# Patient Record
Sex: Female | Born: 1965 | Race: White | Hispanic: No | Marital: Married | Smoking: Never smoker
Health system: Southern US, Community
[De-identification: ages and names within clinical notes are randomized; demographics above are authoritative.]

## PROBLEM LIST (undated history)

## (undated) DIAGNOSIS — E538 Deficiency of other specified B group vitamins: Secondary | ICD-10-CM

## (undated) DIAGNOSIS — E6609 Other obesity due to excess calories: Secondary | ICD-10-CM

## (undated) DIAGNOSIS — R7303 Prediabetes: Secondary | ICD-10-CM

## (undated) DIAGNOSIS — E063 Autoimmune thyroiditis: Secondary | ICD-10-CM

## (undated) DIAGNOSIS — F419 Anxiety disorder, unspecified: Secondary | ICD-10-CM

## (undated) DIAGNOSIS — E079 Disorder of thyroid, unspecified: Secondary | ICD-10-CM

## (undated) DIAGNOSIS — E559 Vitamin D deficiency, unspecified: Secondary | ICD-10-CM

## (undated) DIAGNOSIS — E785 Hyperlipidemia, unspecified: Secondary | ICD-10-CM

## (undated) DIAGNOSIS — I10 Essential (primary) hypertension: Secondary | ICD-10-CM

## (undated) DIAGNOSIS — J309 Allergic rhinitis, unspecified: Secondary | ICD-10-CM

## (undated) DIAGNOSIS — D649 Anemia, unspecified: Secondary | ICD-10-CM

## (undated) DIAGNOSIS — R609 Edema, unspecified: Secondary | ICD-10-CM

## (undated) DIAGNOSIS — E119 Type 2 diabetes mellitus without complications: Secondary | ICD-10-CM

## (undated) DIAGNOSIS — E7801 Familial hypercholesterolemia: Secondary | ICD-10-CM

## (undated) DIAGNOSIS — E78019 Familial hypercholesterolemia, unspecified: Secondary | ICD-10-CM

## (undated) HISTORY — DX: Vitamin D deficiency, unspecified: E55.9

## (undated) HISTORY — DX: Hyperlipidemia, unspecified: E78.5

## (undated) HISTORY — PX: COMBINED HYSTEROSCOPY DIAGNOSTIC / D&C: SUR297

## (undated) HISTORY — DX: Edema, unspecified: R60.9

## (undated) HISTORY — DX: Deficiency of other specified B group vitamins: E53.8

## (undated) HISTORY — DX: Familial hypercholesterolemia, unspecified: E78.019

## (undated) HISTORY — DX: Essential (primary) hypertension: I10

## (undated) HISTORY — DX: Allergic rhinitis, unspecified: J30.9

## (undated) HISTORY — PX: HYSTEROSCOPY: SHX211

## (undated) HISTORY — DX: Familial hypercholesterolemia: E78.01

## (undated) HISTORY — DX: Other obesity due to excess calories: E66.09

## (undated) HISTORY — PX: TUBAL LIGATION: SHX77

## (undated) HISTORY — DX: Type 2 diabetes mellitus without complications: E11.9

## (undated) HISTORY — DX: Anemia, unspecified: D64.9

## (undated) HISTORY — DX: Anxiety disorder, unspecified: F41.9

## (undated) HISTORY — DX: Autoimmune thyroiditis: E06.3

## (undated) HISTORY — DX: Prediabetes: R73.03

## (undated) HISTORY — PX: OTHER SURGICAL HISTORY: SHX169

---

## 2008-04-21 ENCOUNTER — Ambulatory Visit: Payer: Self-pay | Admitting: Unknown Physician Specialty

## 2008-11-03 ENCOUNTER — Ambulatory Visit: Payer: Self-pay | Admitting: Unknown Physician Specialty

## 2008-11-12 ENCOUNTER — Ambulatory Visit: Payer: Self-pay | Admitting: Unknown Physician Specialty

## 2008-12-22 IMAGING — MG MAM DGTL SCREENING MAMMO W/CAD
1 series · 4 of 4 positions shown · non-contrast
Comparison: none

REASON FOR EXAM: scr mammo
COMMENTS:

PROCEDURE:     MAM - MAM DGTL SCREENING MAMMO W/CAD  - April 21, 2008  [DATE]
RESULT:      Comparison is made to the prior exam of 08/25/04.  The breast
parenchyma is almost entirely fatty.  No mass or malignant-appearing
calcifications are seen.

[R CC · right · 4 of 4 slices shown]
[im 1/4]
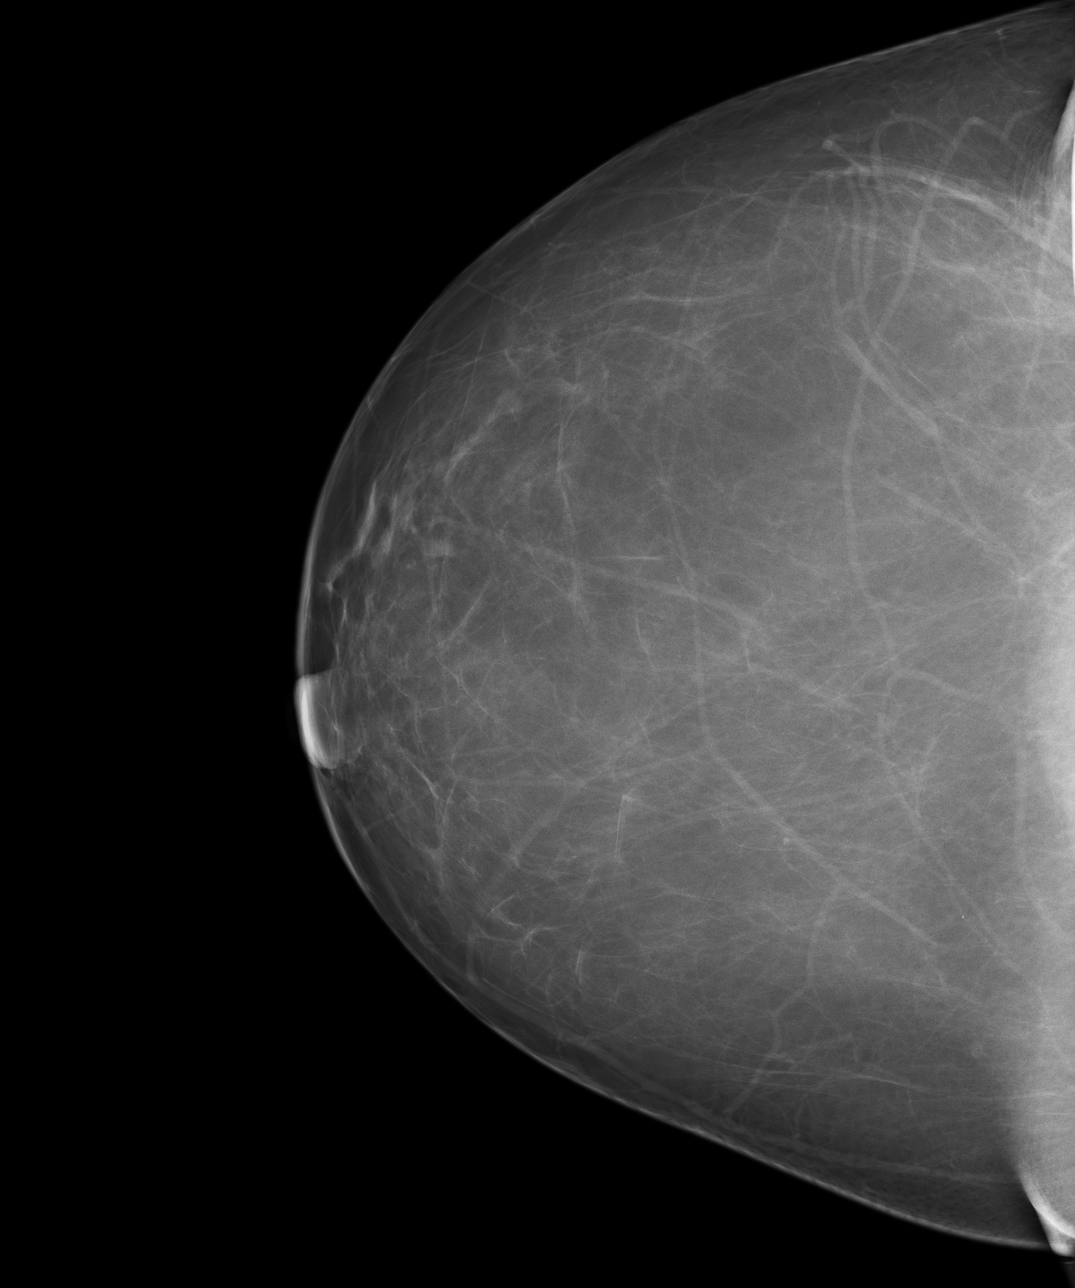
[im 2/4]
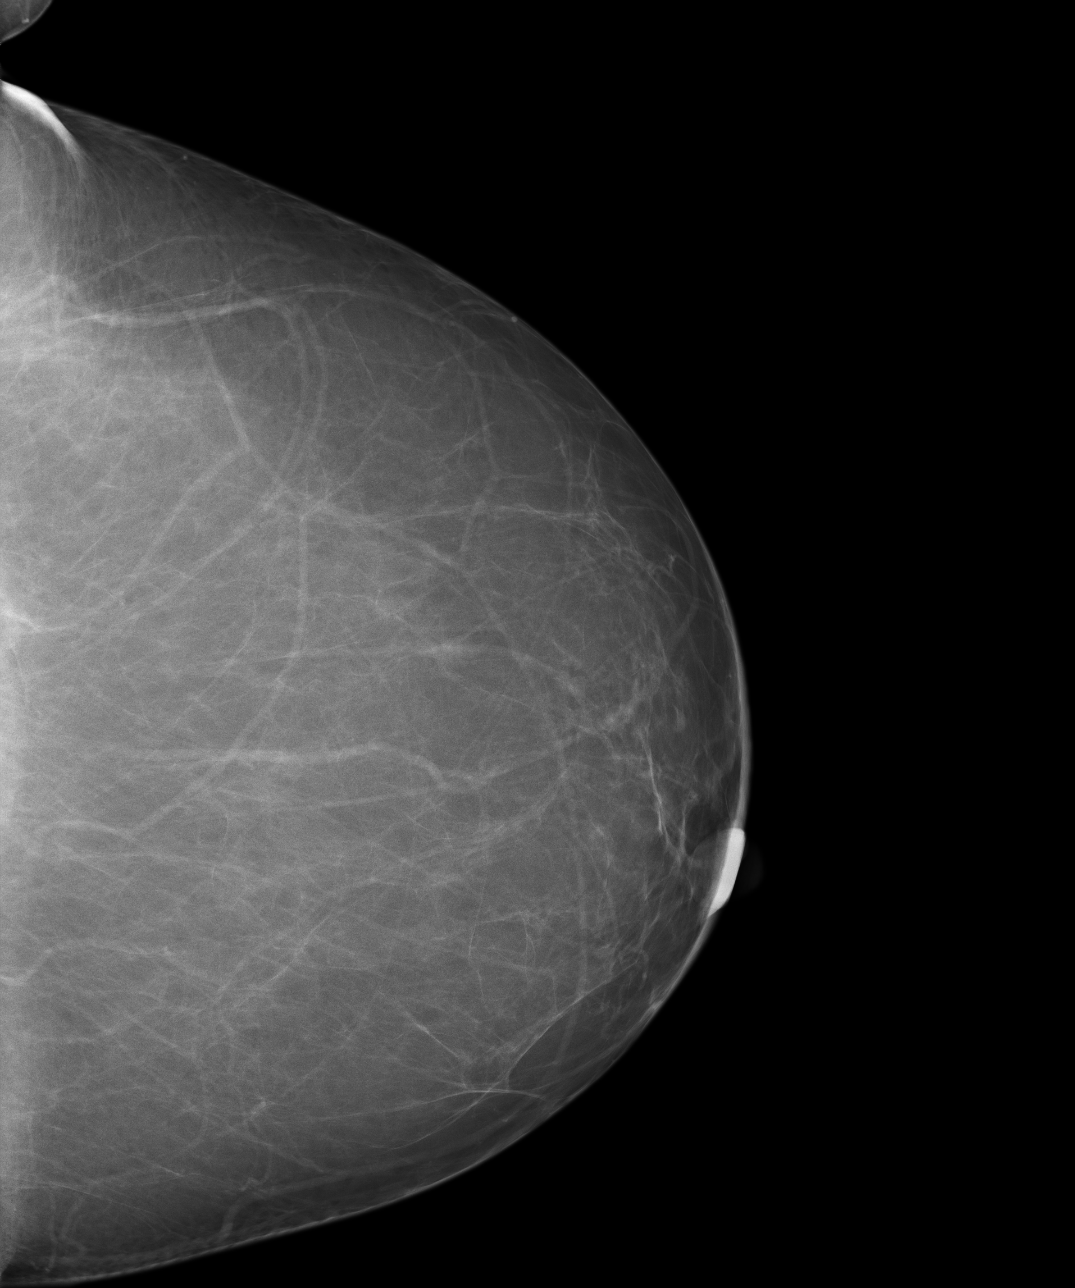
[im 3/4]
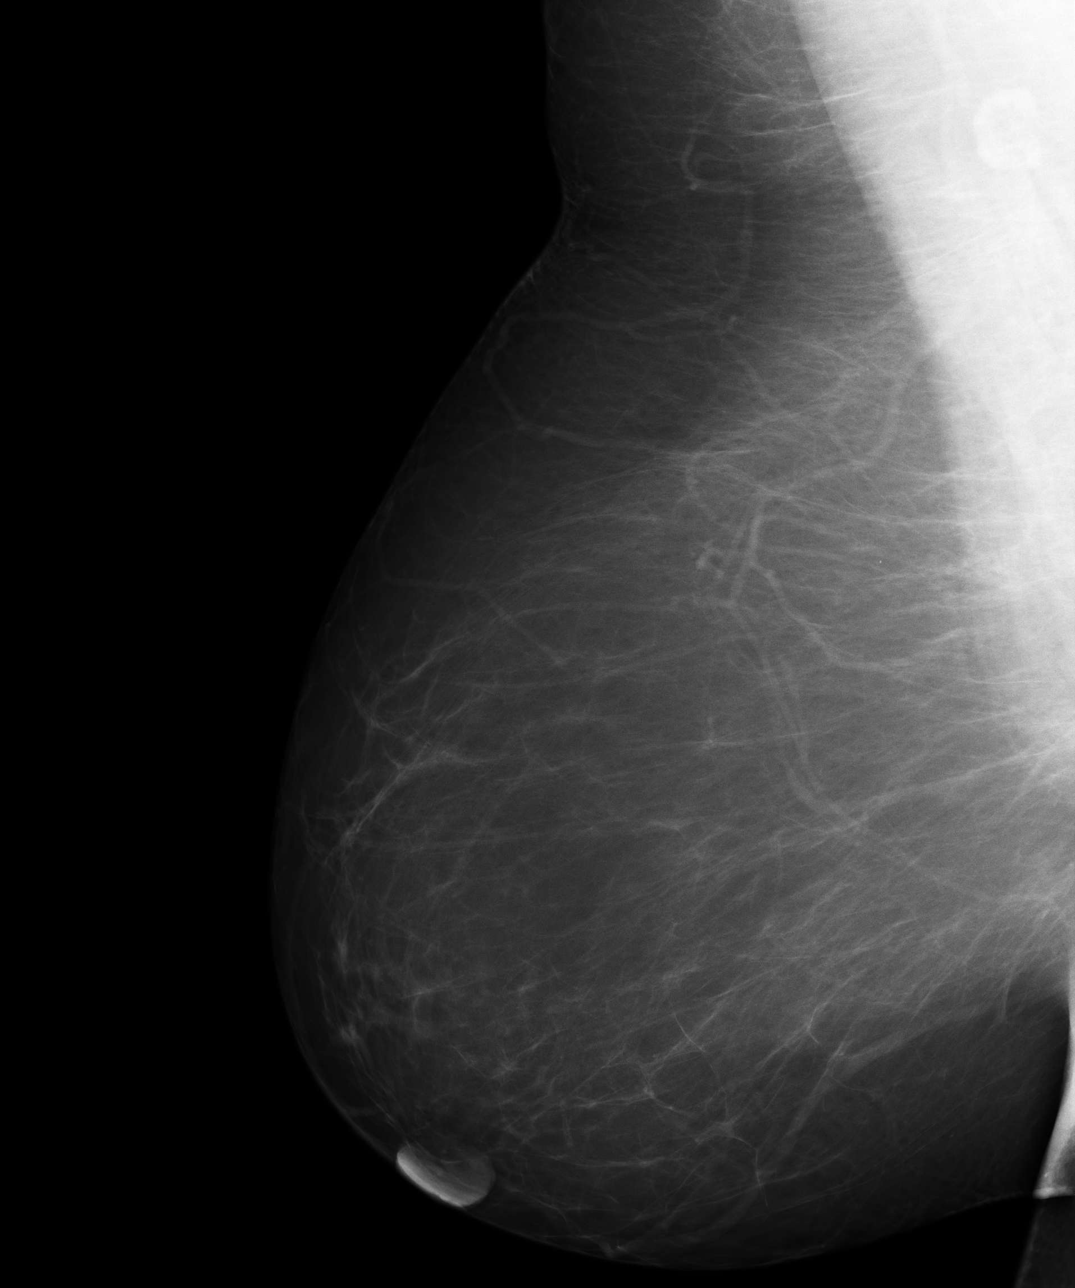
[im 4/4]
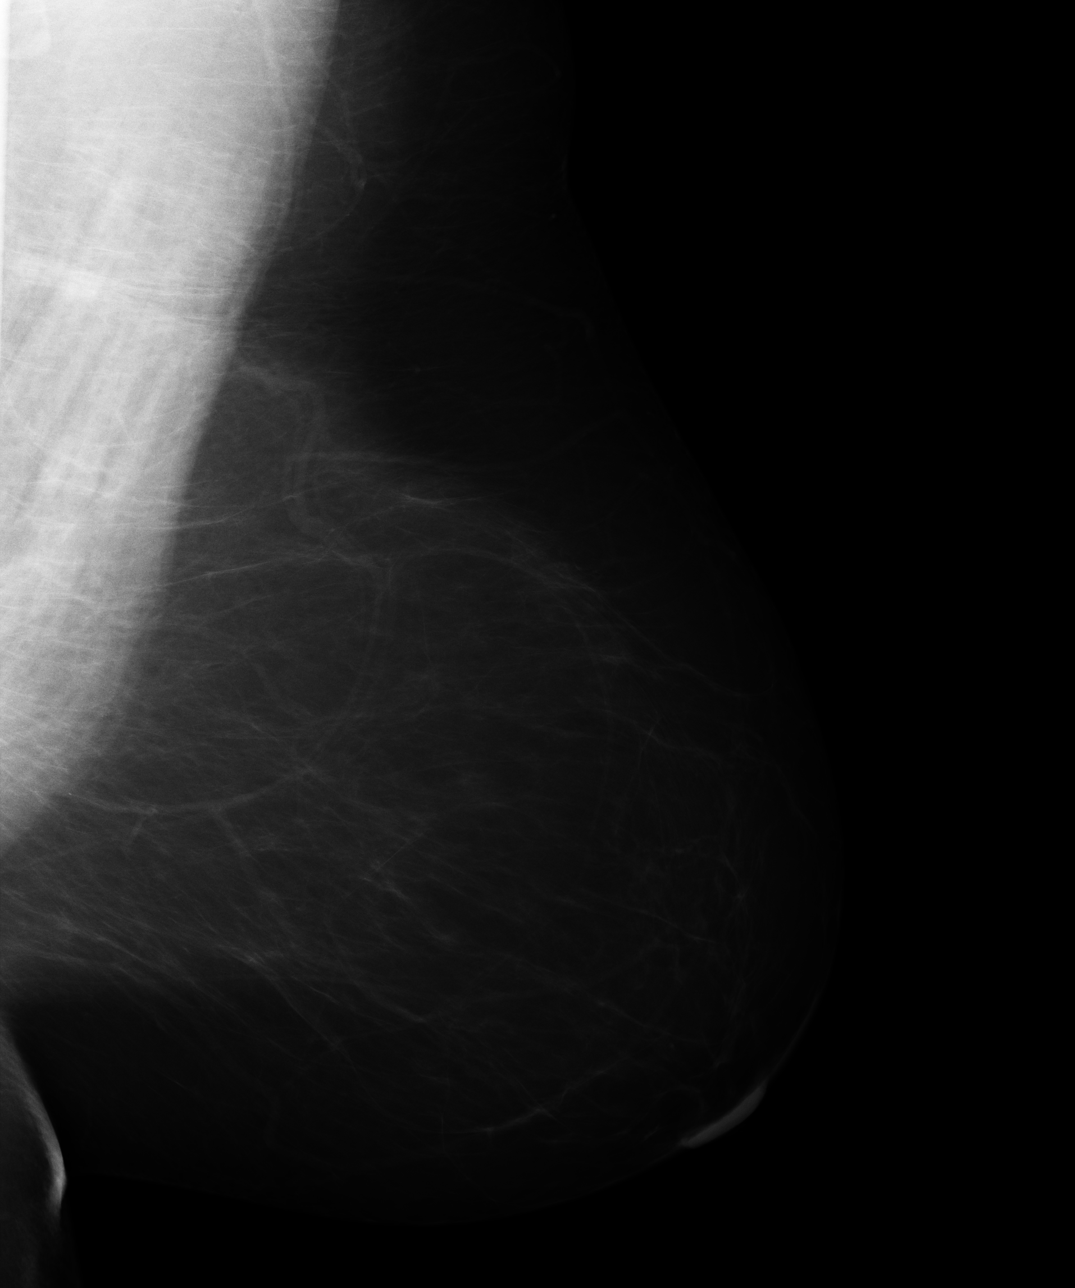

[4 of 4 positions shown; findings below may reference images not displayed]

IMPRESSION: 1.     Bilaterally benign appearing screening mammography.
2.     Annual screening mammography is recommended.
3.     BI-RADS:  Category 1-Negative.

A NEGATIVE MAMMOGRAM REPORT DOES NOT PRECLUDE BIOPSY OR OTHER EVALUATION OF
A CLINICALLY PALPABLE OR OTHERWISE SUPSICIOUS MASS OR LESION.  BREAST CANCER
MAY NOT BE DETECTED BY MAMMOGRAPHY IN UP TO 10% OF CASES.

## 2009-12-06 ENCOUNTER — Ambulatory Visit: Payer: Self-pay | Admitting: Unknown Physician Specialty

## 2010-12-07 ENCOUNTER — Ambulatory Visit: Payer: Self-pay | Admitting: Unknown Physician Specialty

## 2011-05-07 ENCOUNTER — Ambulatory Visit: Payer: Self-pay | Admitting: Family Medicine

## 2012-01-07 IMAGING — CR RIGHT FOOT COMPLETE - 3+ VIEW
1 series · 3 of 3 positions shown · non-contrast
Comparison: none

REASON FOR EXAM: pain inside right foot. bruise outside foot
COMMENTS:

PROCEDURE:     MDR - MDR FOOT RT COMP W/OBLIQUES  - May 07, 2011 [DATE]
RESULT:     Comparison:  None

[Series 1: view not recorded · 0.17mm/px · 3 of 3 slices shown]
[im 1/3]
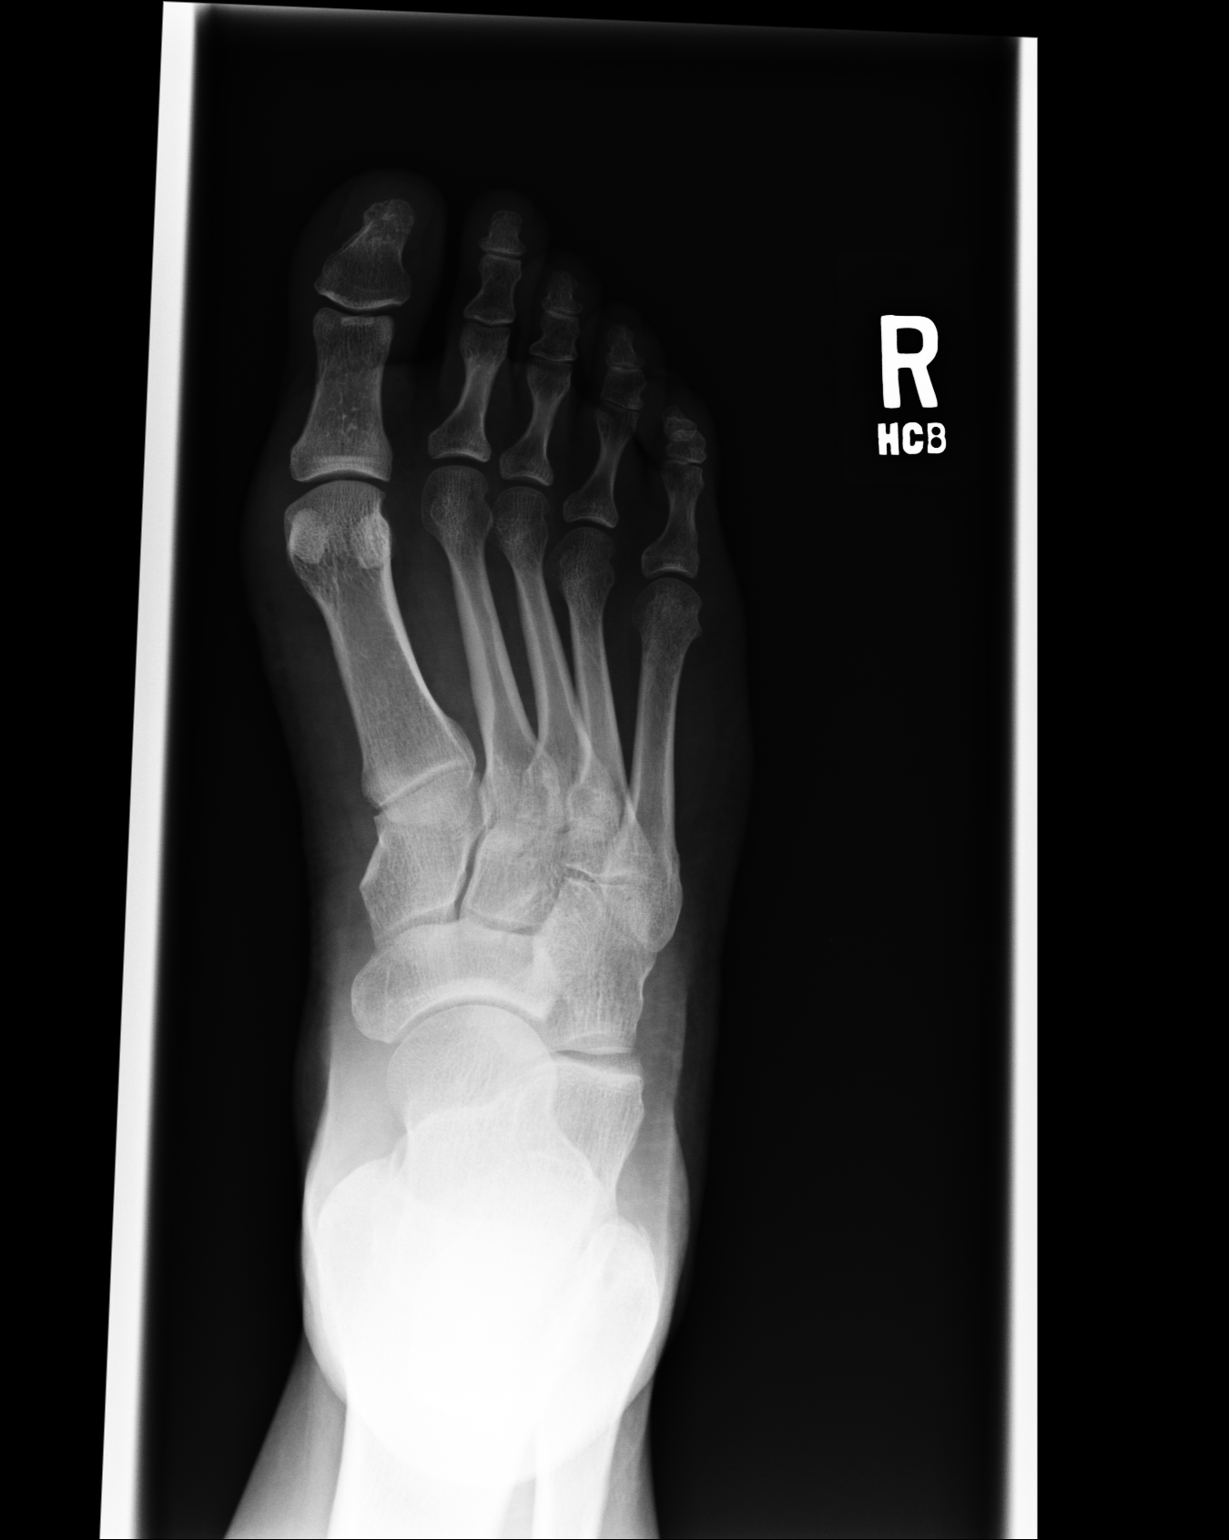
[im 2/3]
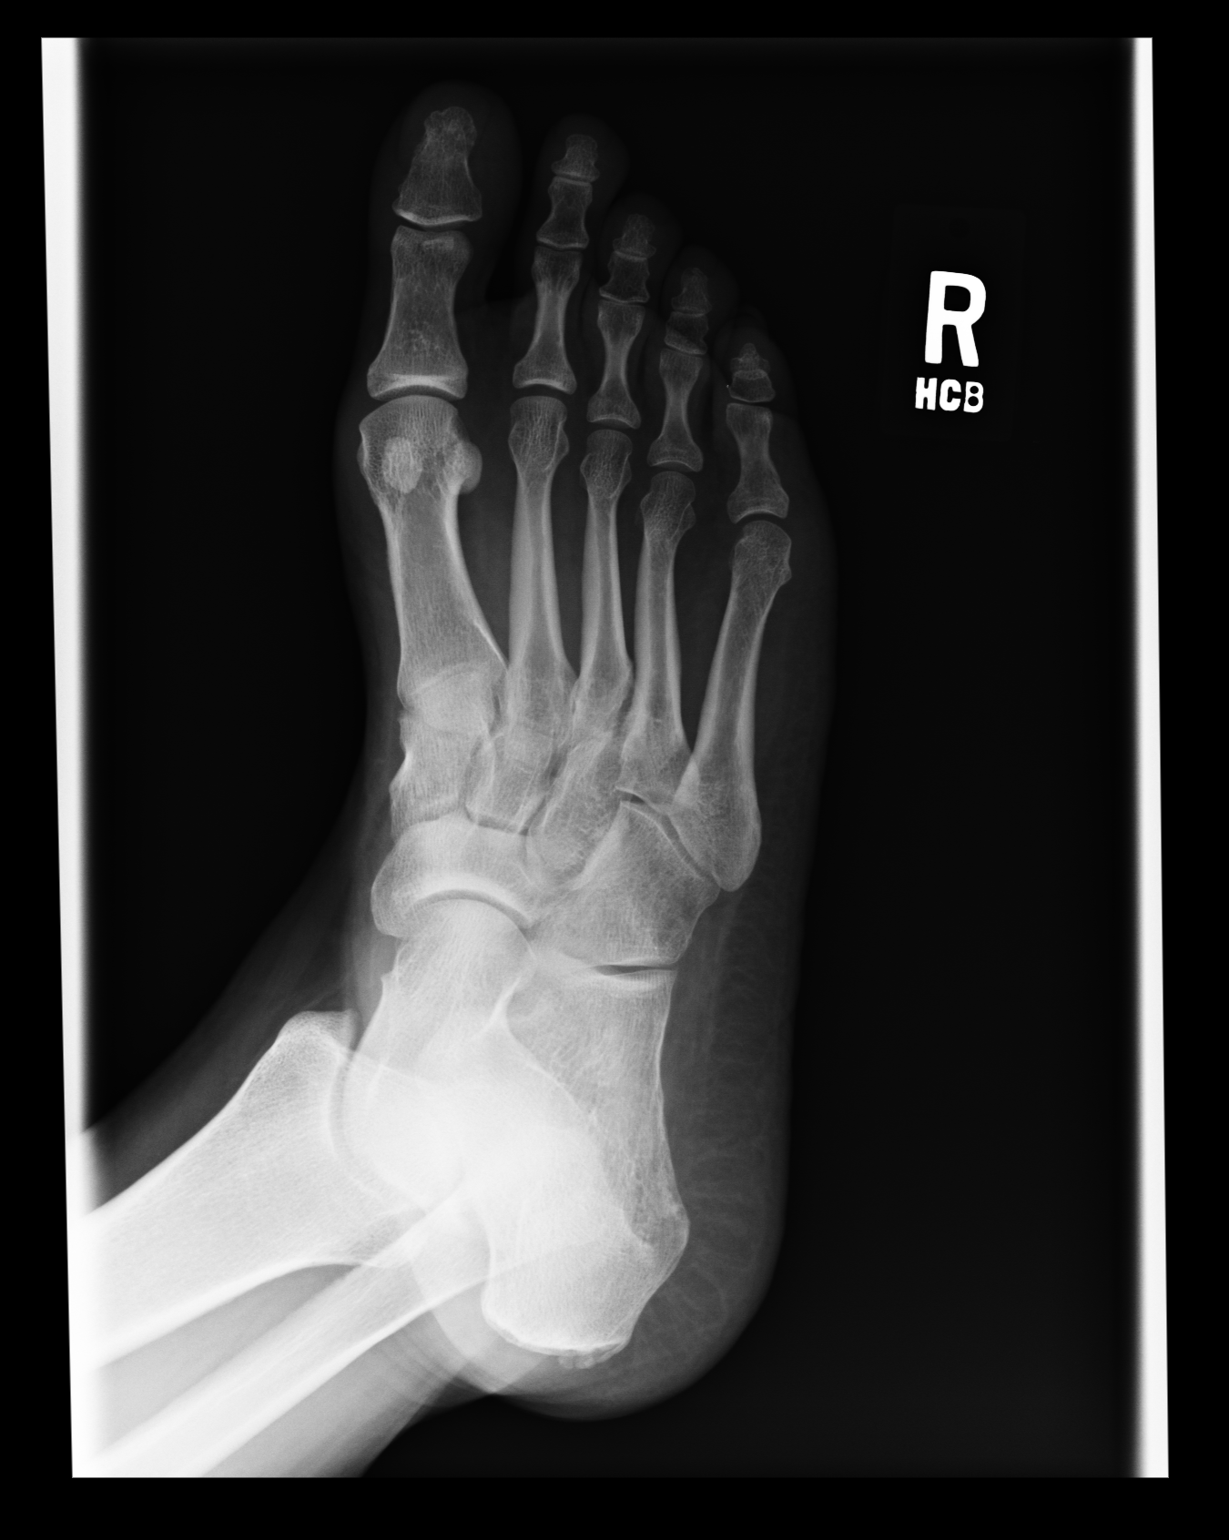
[im 3/3]
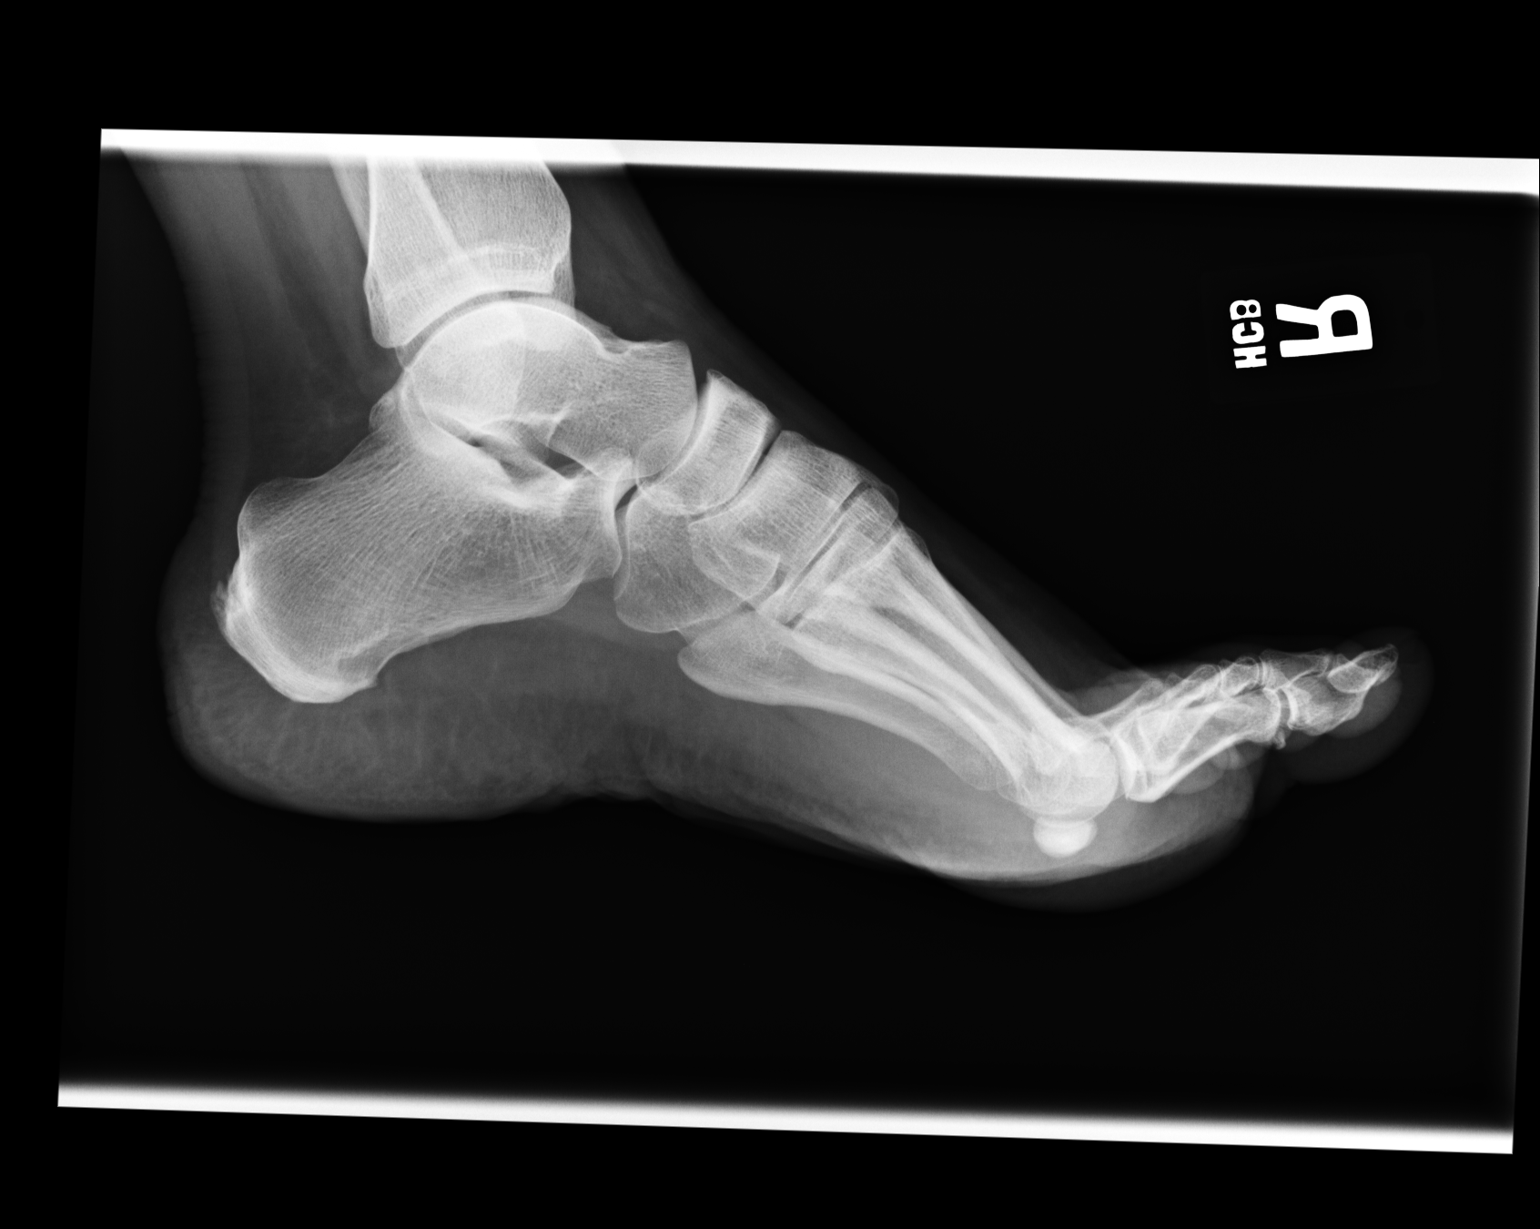

[3 of 3 positions shown; findings below may reference images not displayed]

FINDINGS: AP, oblique, and lateral views of the right foot demonstrates no fracture or
dislocation. There is no soft tissue abnormality. There is no subcutaneous
emphysema or radiopaque foreign bodies.
IMPRESSION: No acute osseous injury of the right foot.

## 2015-04-19 ENCOUNTER — Ambulatory Visit: Admission: EM | Admit: 2015-04-19 | Discharge: 2015-04-19 | Discharge status: Absent without leave | Payer: Self-pay

## 2015-04-19 ENCOUNTER — Encounter: Payer: Self-pay | Admitting: Emergency Medicine

## 2015-04-19 ENCOUNTER — Ambulatory Visit
Admission: EM | Admit: 2015-04-19 | Discharge: 2015-04-19 | Disposition: A | Discharge status: Home / Self Care | Payer: 59 | Attending: Internal Medicine | Admitting: Internal Medicine

## 2015-04-19 DIAGNOSIS — M533 Sacrococcygeal disorders, not elsewhere classified: Secondary | ICD-10-CM

## 2015-04-19 DIAGNOSIS — M545 Low back pain, unspecified: Secondary | ICD-10-CM

## 2015-04-19 HISTORY — DX: Disorder of thyroid, unspecified: E07.9

## 2015-04-19 MED ORDER — CYCLOBENZAPRINE HCL 10 MG PO TABS: 10.0000 mg | ORAL_TABLET | Freq: Two times a day (BID) | ORAL | Status: DC | PRN

## 2015-04-19 MED ORDER — MELOXICAM 15 MG PO TABS: 15.0000 mg | ORAL_TABLET | Freq: Every day | ORAL | Status: DC

## 2015-04-19 NOTE — ED Notes (Signed)
Back pain started 2 weeks ago, across lower back, has not gotten any better. Always worse in morning, stiff. Usually better by evening. Hurts more now to take a deep breath, and pain in R hip pain.

## 2015-04-19 NOTE — Discharge Instructions (Signed)
Back Exercises °Back exercises help treat and prevent back injuries. The goal of back exercises is to increase the strength of your abdominal and back muscles and the flexibility of your back. These exercises should be started when you no longer have back pain. Back exercises include: °· Pelvic Tilt. Lie on your back with your knees bent. Tilt your pelvis until the lower part of your back is against the floor. Hold this position 5 to 10 sec and repeat 5 to 10 times. °· Knee to Chest. Pull first 1 knee up against your chest and hold for 20 to 30 seconds, repeat this with the other knee, and then both knees. This may be done with the other leg straight or bent, whichever feels better. °· Sit-Ups or Curl-Ups. Bend your knees 90 degrees. Start with tilting your pelvis, and do a partial, slow sit-up, lifting your trunk only 30 to 45 degrees off the floor. Take at least 2 to 3 seconds for each sit-up. Do not do sit-ups with your knees out straight. If partial sit-ups are difficult, simply do the above but with only tightening your abdominal muscles and holding it as directed. °· Hip-Lift. Lie on your back with your knees flexed 90 degrees. Push down with your feet and shoulders as you raise your hips a couple inches off the floor; hold for 10 seconds, repeat 5 to 10 times. °· Back arches. Lie on your stomach, propping yourself up on bent elbows. Slowly press on your hands, causing an arch in your low back. Repeat 3 to 5 times. Any initial stiffness and discomfort should lessen with repetition over time. °· Shoulder-Lifts. Lie face down with arms beside your body. Keep hips and torso pressed to floor as you slowly lift your head and shoulders off the floor. °Do not overdo your exercises, especially in the beginning. Exercises may cause you some mild back discomfort which lasts for a few minutes; however, if the pain is more severe, or lasts for more than 15 minutes, do not continue exercises until you see your caregiver.  Improvement with exercise therapy for back problems is slow.  °See your caregivers for assistance with developing a proper back exercise program. °Document Released: 01/04/2005 Document Revised: 02/19/2012 Document Reviewed: 09/28/2011 °ExitCare® Patient Information ©2015 [employer]. This information is not intended to replace advice given to you by your health care provider. Make sure you discuss any questions you have with your health care provider. ° °Back Pain, Adult °Low back pain is very common. About 1 in 5 people have back pain. The cause of low back pain is rarely dangerous. The pain often gets better over time. About half of people with a sudden onset of back pain feel better in just 2 weeks. About 8 in 10 people feel better by 6 weeks.  °CAUSES °Some common causes of back pain include: °· Strain of the muscles or ligaments supporting the spine. °· Wear and tear (degeneration) of the spinal discs. °· Arthritis. °· Direct injury to the back. °DIAGNOSIS °Most of the time, the direct cause of low back pain is not known. However, back pain can be treated effectively even when the exact cause of the pain is unknown. Answering your caregiver's questions about your overall health and symptoms is one of the most accurate ways to make sure the cause of your pain is not dangerous. If your caregiver needs more information, he or she may order lab work or imaging tests (X-rays or MRIs). However, even if imaging tests show changes in your   back, this usually does not require surgery. HOME CARE INSTRUCTIONS For many people, back pain returns.Since low back pain is rarely dangerous, it is often a condition that people can learn to [facility]manageon their own.   Remain active. It is stressful on the back to sit or stand in one place. Do not sit, drive, or stand in one place for more than 30 minutes at a time. Take short walks on level surfaces as soon as pain allows.Try to increase the length of time you walk each  day.  Do not stay in bed.Resting more than 1 or 2 days can delay your recovery.  Do not avoid exercise or work.Your body is made to move.It is not dangerous to be active, even though your back may hurt.Your back will likely heal faster if you return to being active before your pain is gone.  Pay attention to your body when you bend and lift. Many people have less discomfortwhen lifting if they bend their knees, keep the load close to their bodies,and avoid twisting. Often, the most comfortable positions are those that put less stress on your recovering back.  Find a comfortable position to sleep. Use a firm mattress and lie on your side with your knees slightly bent. If you lie on your back, put a pillow under your knees.  Only take over-the-counter or prescription medicines as directed by your caregiver. Over-the-counter medicines to reduce pain and inflammation are often the most helpful.Your caregiver may prescribe muscle relaxant drugs.These medicines help dull your pain so you can more quickly return to your normal activities and healthy exercise.  Put ice on the injured area.  Put ice in a plastic bag.  Place a towel between your skin and the bag.  Leave the ice on for 15-20 minutes, 03-04 times a day for the first 2 to 3 days. After that, ice and heat may be alternated to reduce pain and spasms.  Ask your caregiver about trying back exercises and gentle massage. This may be of some benefit.  Avoid feeling anxious or stressed.Stress increases muscle tension and can worsen back pain.It is important to recognize when you are anxious or stressed and learn ways to manage it.Exercise is a great option. SEEK MEDICAL CARE IF:  You have pain that is not relieved with rest or medicine.  You have pain that does not improve in 1 week.  You have new symptoms.  You are generally not feeling well. SEEK IMMEDIATE MEDICAL CARE IF:   You have pain that radiates from your back into  your legs.  You develop new bowel or bladder control problems.  You have unusual weakness or numbness in your arms or legs.  You develop nausea or vomiting.  You develop abdominal pain.  You feel faint. Document Released: 11/27/2005 Document Revised: 05/28/2012 Document Reviewed: 03/31/2014 [facility]Exit[facility] Patient Information 2015 [place]Care, [place]. This information is not intended to replace advice given to you by your health care provider. Make sure you discuss any questions you have with your health care provider.  Sacroiliac Joint Dysfunction The sacroiliac joint connects the lower part of the spine (the sacrum) with the bones of the pelvis. CAUSES  Sometimes, there is no obvious reason for sacroiliac joint dysfunction. Other times, it may occur   During pregnancy.  After injury, such as:  Car accidents.  Sport-related injuries.  Work-related injuries.  Due to one leg being shorter than the other.  Due to other conditions that affect the joints, such as:  Rheumatoid arthritis.  Gout.  Psoriasis.  Joint infection (septic arthritis). SYMPTOMS  Symptoms may include:  Pain in the:  Lower back.  Buttocks.  Groin.  Thighs and legs.  Difficult sitting, standing, walking, lying, bending or lifting. DIAGNOSIS  A number of tests may be used to help diagnose the cause of sacroiliac joint dysfunction, including:  Imaging tests to look for other causes of pain, including:  MRI.  CT scan.  Bone scan.  Diagnostic injection: During a special x-ray (called fluoroscopy), a needle is put into the sacroiliac joint. A numbing medicine is injected into the joint. If the pain is improved or stopped, the diagnosis of sacroiliac joint dysfunction is more likely. TREATMENT  There are a number of types of treatment used for sacroiliac joint dysfunction, including:  Only take over-the-counter or prescription medicines for pain, discomfort, or fever as directed by your  caregiver.  Medications to relax muscles.  Rest. Decreasing activity can help cut down on painful muscle spasms and allow the back to heal.  Application of heat or ice to the lower back may improve muscle spasms and soothe pain.  Brace. A special back brace, called a sacroiliac belt, can help support the joint while your back is healing.  Physical therapy can help teach comfortable positions and exercises to strengthen muscles that support the sacroiliac joint.  Cortisone injections. Injections of steroid medicine into the joint can help decrease swelling and improve pain.  Hyaluronic acid injections. This chemical improves lubrication within the sacroiliac joint, thereby decreasing pain.  Radiofrequency ablation. A special needle is placed into the joint, where it burns away nerves that are carrying pain messages from the joint.  Surgery. Because pain occurs during movement of the joint, screws and plates may be installed in order to limit or prevent joint motion. HOME CARE INSTRUCTIONS   Take all medications exactly as directed.  Follow instructions regarding both rest and physical activity, to avoid worsening the pain.  Do physical therapy exercises exactly as prescribed. SEEK IMMEDIATE MEDICAL CARE IF:  You experience increasingly severe pain.  You develop new symptoms, such as numbness or tingling in your legs or feet.  You lose bladder or bowel control. Document Released: 02/23/2009 Document Revised: 02/19/2012 Document Reviewed: 02/23/2009 [facility] AscExitCare Patient Information 2015 [place] ParkExitCare, MarylandLLC. This information is not intended to replace advice given to you by your health care provider. Make sure you discuss any questions you have with your health care provider.

## 2015-04-19 NOTE — ED Provider Notes (Signed)
CSN: [id number]     Arrival date & time 04/19/15  1610 History   First MD Initiated Contact with Patient 04/19/15 1659     Chief Complaint  Patient presents with  . Back Pain   (Consider location/radiation/quality/duration/timing/severity/associated sxs/prior Treatment) HPI      49 year old female presents complaining of low back pain. For 2 weeks she has had right-sided lower back pain. He feels a good tight spasm that radiates around her right side and her hip. It is worse with movement and worse with walking. Feels okay in the morning but gets worse throughout the day. It seems to get better with ibuprofen. She denies any extremity numbness or weakness. No loss of bowel or bladder control. She denies any injury. No history of immune system problems, no history of IV drug use. She has had similar pain multiple times in the past but usually will not last this long.  Past Medical History  Diagnosis Date  . Thyroid disease    Past Surgical History  Procedure Laterality Date  . Cesarian section      4x   No family history on file. History  Substance Use Topics  . Smoking status: Never Smoker   . Smokeless tobacco: Not on file  . Alcohol Use: No   OB History    No data available     Review of Systems  Musculoskeletal: Positive for back pain and arthralgias.  All other systems reviewed and are negative.   Allergies  Erythromycin  Home Medications   Prior to Admission medications   Medication Sig Start Date End Date Taking? Authorizing Provider  levothyroxine (SYNTHROID, LEVOTHROID) 88 MCG tablet Take 88 mcg by mouth daily before breakfast.   Yes Historical Provider, MD  liothyronine (CYTOMEL) 25 MCG tablet Take by mouth daily.   Yes Historical Provider, MD  cyclobenzaprine (FLEXERIL) 10 MG tablet Take 1 tablet (10 mg total) by mouth 2 (two) times daily as needed for muscle spasms. 04/19/15   Graylon GoodZachary H Jewelia Bocchino, PA-C  meloxicam (MOBIC) 15 MG tablet Take 1 tablet (15 mg total) by  mouth daily. 04/19/15   Adrian BlackwaterZachary H Tenita Cue, PA-C   BP 152/87 mmHg  Pulse 93  Temp(Src) 97.5 F (36.4 C) (Oral)  Resp 18  Ht 5\' 5"  (1.651 m)  Wt 250 lb (113.399 kg)  BMI 41.60 kg/m2  SpO2 99% Physical Exam  Constitutional: She is oriented to person, place, and time. Vital signs are normal. She appears well-developed and well-nourished. No distress.  HENT:  Head: Normocephalic and atraumatic.  Cardiovascular: Normal rate, regular rhythm and normal heart sounds.   Pulmonary/Chest: Effort normal and breath sounds normal. No respiratory distress.  Musculoskeletal:       Right hip: Normal. She exhibits normal range of motion and normal strength.       Left hip: Normal. She exhibits normal range of motion and normal strength.       Lumbar back: She exhibits tenderness (minimal tenderness around the area of the right sacroiliac joint and lateral to this). She exhibits normal range of motion, no bony tenderness, no swelling, no edema, no deformity and no pain.  Neurological: She is alert and oriented to person, place, and time. She has normal strength and normal reflexes. No sensory deficit. She exhibits normal muscle tone. She displays a negative Romberg sign. Coordination and gait normal.  Skin: Skin is warm and dry. No rash noted. She is not diaphoretic.  Psychiatric: She has a normal mood and affect. Judgment normal.  Nursing note and vitals reviewed.   ED Course  Procedures (including critical care time) Labs Review Labs Reviewed - No data to display  Imaging Review No results found.   MDM   1. Right-sided low back pain without sciatica   2. Sacroiliac dysfunction    Low back pain/sacroiliitis on the right, no red flags. We discussed cinematic management, stretching exercises. Recommended her to pursue physical therapy if this continues to be a recurrent issue for her.   Meds ordered this encounter  Medications  . levothyroxine (SYNTHROID, LEVOTHROID) 88 MCG tablet    Sig: Take  88 mcg by mouth daily before breakfast.  . liothyronine (CYTOMEL) 25 MCG tablet    Sig: Take by mouth daily.  . meloxicam (MOBIC) 15 MG tablet    Sig: Take 1 tablet (15 mg total) by mouth daily.    Dispense:  30 tablet    Refill:  5  . cyclobenzaprine (FLEXERIL) 10 MG tablet    Sig: Take 1 tablet (10 mg total) by mouth 2 (two) times daily as needed for muscle spasms.    Dispense:  20 tablet    Refill:  0       Graylon GoodZachary H Jung Yurchak, PA-C 04/19/15 1718

## 2015-04-20 ENCOUNTER — Telehealth: Payer: Self-pay

## 2015-04-20 NOTE — ED Notes (Signed)
Informed patient that writer will call her back if provider on duty today wants to do some other treatment. Pt states "I am apprehensive about taking any other medication, I will be more aggressive with ice. I just wanted to make you aware that I had this reaction to this medication." Pt very pleasant.

## 2016-10-31 ENCOUNTER — Other Ambulatory Visit: Payer: Self-pay | Admitting: Obstetrics & Gynecology

## 2016-10-31 DIAGNOSIS — Z1231 Encounter for screening mammogram for malignant neoplasm of breast: Secondary | ICD-10-CM

## 2016-11-10 ENCOUNTER — Ambulatory Visit: Admission: RE | Admit: 2016-11-10 | Payer: 59 | Source: Ambulatory Visit

## 2016-11-27 ENCOUNTER — Ambulatory Visit: Admission: RE | Admit: 2016-11-27 | Payer: 59 | Source: Ambulatory Visit

## 2018-05-17 ENCOUNTER — Ambulatory Visit (INDEPENDENT_AMBULATORY_CARE_PROVIDER_SITE_OTHER): Payer: 59 | Admitting: Obstetrics & Gynecology

## 2018-05-17 ENCOUNTER — Encounter: Payer: Self-pay | Admitting: Obstetrics & Gynecology

## 2018-05-17 VITALS — BP 130/80 | Ht 65.0 in | Wt 241.0 lb

## 2018-05-17 DIAGNOSIS — Z1211 Encounter for screening for malignant neoplasm of colon: Secondary | ICD-10-CM | POA: Diagnosis not present

## 2018-05-17 DIAGNOSIS — Z1231 Encounter for screening mammogram for malignant neoplasm of breast: Secondary | ICD-10-CM | POA: Diagnosis not present

## 2018-05-17 DIAGNOSIS — Z0000 Encounter for general adult medical examination without abnormal findings: Secondary | ICD-10-CM

## 2018-05-17 DIAGNOSIS — Z1239 Encounter for other screening for malignant neoplasm of breast: Secondary | ICD-10-CM

## 2018-05-17 MED ORDER — CLOTRIMAZOLE-BETAMETHASONE 1-0.05 % EX CREA: 1.0000 "application " | TOPICAL_CREAM | Freq: Two times a day (BID) | CUTANEOUS | 0 refills | Status: DC

## 2018-05-17 NOTE — Progress Notes (Signed)
HPI:      [name] is a 52 y.o. [phone] who LMP was in the past, she presents today for her annual examination.  The patient has no complaints today. The patient is sexually active. Herlast pap: approximate date 2017 and was normal and last mammogram: approximate date 2013 and was normal.  The patient does perform self breast exams.  There is no notable family history of breast or ovarian cancer in her family. The patient is not taking hormone replacement therapy. Patient denies post-menopausal vaginal bleeding.   The patient has regular exercise: yes. The patient denies current symptoms of depression.    GYN Hx: Last Colonoscopy:never ago. Normal.  Last DEXA: never ago.    PMHx: Past Medical History:  Diagnosis Date  . Anemia   . Borderline diabetes   . Thyroid disease    Past Surgical History:  Procedure Laterality Date  . cesarian section     4x  . COMBINED HYSTEROSCOPY DIAGNOSTIC / D&C    . HYSTEROSCOPY    . TUBAL LIGATION     Family History  Problem Relation Age of Onset  . Diabetes Mother   . Hypertension Mother   . Prostate cancer Father    Social History   Tobacco Use  . Smoking status: Never Smoker  . Smokeless tobacco: Never Used  Substance Use Topics  . Alcohol use: No  . Drug use: Never    Current Outpatient Medications:  Marland Kitchen  Vitamin D, Ergocalciferol, (DRISDOL) 50000 units CAPS capsule, Take 50,000 Units by mouth every 7 (seven) days., Disp: , Rfl:  .  ARMOUR THYROID 120 MG tablet, , Disp: , Rfl:  .  clotrimazole-betamethasone (LOTRISONE) cream, Apply 1 application topically 2 (two) times daily., Disp: 30 g, Rfl: 0 .  cyclobenzaprine (FLEXERIL) 10 MG tablet, Take 1 tablet (10 mg total) by mouth 2 (two) times daily as needed for muscle spasms. (Patient not taking: Reported on 05/17/2018), Disp: 20 tablet, Rfl: 0 .  levothyroxine (SYNTHROID, LEVOTHROID) 88 MCG tablet, Take 88 mcg by mouth daily before breakfast., Disp: , Rfl:  .  liothyronine  (CYTOMEL) 25 MCG tablet, Take by mouth daily., Disp: , Rfl:  .  meloxicam (MOBIC) 15 MG tablet, Take 1 tablet (15 mg total) by mouth daily. (Patient not taking: Reported on 05/17/2018), Disp: 30 tablet, Rfl: 5 Allergies: Erythromycin  Review of Systems  Constitutional: Negative for chills, fever and malaise/fatigue.  HENT: Negative for congestion, sinus pain and sore throat.   Eyes: Negative for blurred vision and pain.  Respiratory: Negative for cough and wheezing.   Cardiovascular: Negative for chest pain and leg swelling.  Gastrointestinal: Negative for abdominal pain, constipation, diarrhea, heartburn, nausea and vomiting.  Genitourinary: Negative for dysuria, frequency, hematuria and urgency.  Musculoskeletal: Negative for back pain, joint pain, myalgias and neck pain.  Skin: Negative for itching and rash.  Neurological: Negative for dizziness, tremors and weakness.  Endo/Heme/Allergies: Does not bruise/bleed easily.  Psychiatric/Behavioral: Negative for depression. The patient is not nervous/anxious and does not have insomnia.     Objective: BP 130/80   Ht 5\' 5"  (1.651 m)   Wt 241 lb (109.3 kg)   LMP 05/11/2018   BMI 40.10 kg/m   Filed Weights   05/17/18 1040  Weight: 241 lb (109.3 kg)   Body mass index is 40.1 kg/m. Physical Exam  Constitutional: She is oriented to person, place, and time. She appears well-developed and well-nourished. No distress.  Genitourinary: Rectum normal, vagina normal and uterus  normal. Pelvic exam was performed with patient supine. There is no rash or lesion on the right labia. There is no rash or lesion on the left labia. Vagina exhibits no lesion. No bleeding in the vagina. Right adnexum does not display mass and does not display tenderness. Left adnexum does not display mass and does not display tenderness. Cervix does not exhibit motion tenderness, lesion, friability or polyp.   Uterus is mobile and midaxial. Uterus is not enlarged or exhibiting a  mass.  HENT:  Head: Normocephalic and atraumatic. Head is without laceration.  Right Ear: Hearing normal.  Left Ear: Hearing normal.  Nose: No epistaxis.  No foreign bodies.  Mouth/Throat: Uvula is midline, oropharynx is clear and moist and mucous membranes are normal.  Eyes: Pupils are equal, round, and reactive to light.  Neck: Normal range of motion. Neck supple. No thyromegaly present.  Cardiovascular: Normal rate and regular rhythm. Exam reveals no gallop and no friction rub.  No murmur heard. Pulmonary/Chest: Effort normal and breath sounds normal. No respiratory distress. She has no wheezes. Right breast exhibits no mass, no skin change and no tenderness. Left breast exhibits no mass, no skin change and no tenderness.  Abdominal: Soft. Bowel sounds are normal. She exhibits no distension. There is no tenderness. There is no rebound.  Musculoskeletal: Normal range of motion.  Neurological: She is alert and oriented to person, place, and time. No cranial nerve deficit.  Skin: Skin is warm and dry.  Psychiatric: She has a normal mood and affect. Judgment normal.  Vitals reviewed.   Assessment: Annual Exam 1. Annual physical exam   2. Screening for breast cancer   3. Screen for colon cancer     Plan:            1.  Cervical Screening-  Pap smear schedule reviewed with patient  2. Breast screening- Exam annually and mammogram scheduled  3. Colonoscopy every 10 years- referral, prefers Dr Elliot,  Hemoccult testing next yMarkham Jordanear  4. Labs managed by PCP  5. Counseling for hormonal therapy: no change in therapy today\  6. 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  Annamarie MajorPaul Shawntez Dickison, MD, Merlinda FrederickFACOG [facility], [facility] 05/17/2018  11:07 AM

## 2018-05-17 NOTE — Patient Instructions (Signed)
PAP every three years Mammogram every year    Call [phone] to schedule at [place] Colonoscopy every 10 years Labs yearly (with PCP) 

## 2018-08-26 ENCOUNTER — Telehealth: Payer: Self-pay

## 2018-08-26 NOTE — Telephone Encounter (Signed)
Left message for pt to call and make appointment for her mammo

## 2018-08-26 NOTE — Telephone Encounter (Signed)
-----   Message from Nadara Mustardobert P Harris, MD sent at 08/22/2018  8:14 AM EDT ----- Regarding: MMG Received notice she has not received MMG yet as ordered at her Annual. Please check and encourage her to do this, and document conversation.

## 2018-09-19 ENCOUNTER — Encounter: Payer: Self-pay | Admitting: Obstetrics & Gynecology

## 2018-09-19 ENCOUNTER — Encounter (INDEPENDENT_AMBULATORY_CARE_PROVIDER_SITE_OTHER): Payer: Self-pay

## 2018-09-19 ENCOUNTER — Ambulatory Visit
Admission: RE | Admit: 2018-09-19 | Discharge: 2018-09-19 | Disposition: A | Discharge status: Home / Self Care | Payer: 59 | Source: Ambulatory Visit | Attending: Obstetrics & Gynecology | Admitting: Obstetrics & Gynecology

## 2018-09-19 DIAGNOSIS — Z1239 Encounter for other screening for malignant neoplasm of breast: Secondary | ICD-10-CM | POA: Diagnosis present

## 2018-10-02 ENCOUNTER — Ambulatory Visit
Admission: EM | Admit: 2018-10-02 | Discharge: 2018-10-02 | Disposition: A | Discharge status: Home / Self Care | Payer: 59 | Attending: Emergency Medicine | Admitting: Emergency Medicine

## 2018-10-02 ENCOUNTER — Encounter: Payer: Self-pay | Admitting: Emergency Medicine

## 2018-10-02 ENCOUNTER — Other Ambulatory Visit: Payer: Self-pay

## 2018-10-02 DIAGNOSIS — S61412A Laceration without foreign body of left hand, initial encounter: Secondary | ICD-10-CM

## 2018-10-02 MED ORDER — TETANUS-DIPHTH-ACELL PERTUSSIS 5-2.5-18.5 LF-MCG/0.5 IM SUSP
0.5000 mL | Freq: Once | INTRAMUSCULAR | Status: AC
  Administered 2018-10-02: 0.5 mL via INTRAMUSCULAR

## 2018-10-02 NOTE — ED Provider Notes (Addendum)
[facility]    CSN: [id number] Arrival date & time: 10/02/18  4098     History   Chief Complaint Chief Complaint  Patient presents with  . Laceration    HPI Amber Mayer is a 52 y.o. female. Patient presents for laceration of left palm. States she cut the hand with scissors while trying to cut her hand this morning. It has been cleaned and is not actively bleeding. States she does need her Tdap updated.   HPI  Past Medical History:  Diagnosis Date  . Anemia   . Borderline diabetes   . Thyroid disease     There are no active problems to display for this patient.   Past Surgical History:  Procedure Laterality Date  . cesarian section     4x  . COMBINED HYSTEROSCOPY DIAGNOSTIC / D&C    . HYSTEROSCOPY    . TUBAL LIGATION      OB History    Gravida  4   Para  4   Term  4   Preterm      AB      Living  4     SAB      TAB      Ectopic      Multiple      Live Births               Home Medications    Prior to Admission medications   Medication Sig Start Date End Date Taking? Authorizing Provider  ARMOUR THYROID 120 MG tablet  04/01/18  Yes [provider]  Vitamin D, Ergocalciferol, (DRISDOL) 50000 units CAPS capsule Take 50,000 Units by mouth every 7 (seven) days.   Yes [provider]  clotrimazole-betamethasone (LOTRISONE) cream Apply 1 application topically 2 (two) times daily. 05/17/18   Nadara Mustard, MD  cyclobenzaprine (FLEXERIL) 10 MG tablet Take 1 tablet (10 mg total) by mouth 2 (two) times daily as needed for muscle spasms. Patient not taking: Reported on 05/17/2018 04/19/15   Graylon Good, PA-C  levothyroxine (SYNTHROID, LEVOTHROID) 88 MCG tablet Take 88 mcg by mouth daily before breakfast.    [provider]  liothyronine (CYTOMEL) 25 MCG tablet Take by mouth daily.    [provider]  meloxicam (MOBIC) 15 MG tablet Take 1 tablet (15 mg total) by mouth daily. Patient not taking:  Reported on 05/17/2018 04/19/15   Graylon Good, PA-C    Family History Family History  Problem Relation Age of Onset  . Diabetes Mother   . Hypertension Mother   . Prostate cancer Father   . Breast cancer Neg Hx     Social History Social History   Tobacco Use  . Smoking status: Never Smoker  . Smokeless tobacco: Never Used  Substance Use Topics  . Alcohol use: No  . Drug use: Never     Allergies   Erythromycin   Review of Systems Review of Systems  Constitutional: Negative for fatigue and fever.  Musculoskeletal: Negative for arthralgias and myalgias.  Skin: Positive for wound. Negative for rash.  Neurological: Negative for dizziness, weakness and numbness.  Hematological: Does not bruise/bleed easily.     Physical Exam Triage Vital Signs ED Triage Vitals  Enc Vitals Group     BP 10/02/18 1027 (!) 142/69     Pulse Rate 10/02/18 1027 80     Resp 10/02/18 1027 17     Temp 10/02/18 1027 98 F (36.7 C)  Temp Source 10/02/18 1027 Oral     SpO2 10/02/18 1027 96 %     Weight 10/02/18 1025 250 lb (113.4 kg)     Height 10/02/18 1025 5\' 5"  (1.651 m)     Head Circumference --      Peak Flow --      Pain Score 10/02/18 1024 0     Pain Loc --      Pain Edu? --      Excl. in GC? --    No data found.  Updated Vital Signs BP (!) 142/69 (BP Location: Right Arm)   Pulse 80   Temp 98 F (36.7 C) (Oral)   Resp 17   Ht 5\' 5"  (1.651 m)   Wt 250 lb (113.4 kg)   SpO2 96%   BMI 41.60 kg/m    Physical Exam  Constitutional: She is oriented to person, place, and time. She appears well-developed and well-nourished.  HENT:  Head: Normocephalic and atraumatic.  Eyes: No scleral icterus.  Cardiovascular: Normal rate, regular rhythm and normal heart sounds.  No murmur heard. Pulmonary/Chest: Effort normal and breath sounds normal. No respiratory distress. She has no wheezes.  Neurological: She is alert and oriented to person, place, and time. Gait normal.  Skin:  Skin is warm and dry. No rash noted.  Left hand: There is a 1 cm superficial laceration of the distal thumb/palm of hand. No active bleeding. No contamination. No significant tenderness. No FB. Full ROM of hand and fingers. NVI. 5/5 grip strength  Psychiatric: She has a normal mood and affect. Her behavior is normal.  Nursing note and vitals reviewed.    UC Treatments / Results  Labs (all labs ordered are listed, but only abnormal results are displayed) Labs Reviewed - No data to display  EKG None  Radiology No results found.  Procedures Procedures (including critical care time)  Medications Ordered in UC Medications  Tdap (BOOSTRIX) injection 0.5 mL (0.5 mLs Intramuscular Given 10/02/18 1033)    Initial Impression / Assessment and Plan / UC Course  I have reviewed the triage vital signs and the nursing notes.  Pertinent labs & imaging results that were available during my care of the patient were reviewed by me and considered in my medical decision making (see chart for details).   Tdap updated today. Laceration is very superficial. Cleaned with normal saline and Hibaclens. No active bleeding. Small band-aid applied and Coban. Discussed wound care and monitoring for infection. Advised when to f/u if needed.  Final Clinical Impressions(s) / UC Diagnoses   Final diagnoses:  Laceration of left hand without foreign body, initial encounter     Discharge Instructions     LACERATION: You Tdap was updated today. No indication for sutures or skin adhesive today. Keep a bandaid on the area. Keep clean and dry. May apply Neosporin. Monitor for signs of infection--redness, swelling, fever, drainage from the area and f/u if signs of infection with our clinic or PCP    ED Prescriptions    None     Controlled Substance Prescriptions [place] Controlled Substance Registry consulted? No   Shirlee Latch, PA-C 10/02/18 1705    Eusebio Friendly B, PA-C 10/02/18 1706

## 2018-10-02 NOTE — ED Triage Notes (Signed)
Pt here today for a lactation on her left hand. She was trimming her hair and hit her thumb. Pt is unsure when her last Tetanus vaccine was.

## 2018-10-02 NOTE — Discharge Instructions (Addendum)
LACERATION: You Tdap was updated today. No indication for sutures or skin adhesive today. Keep a bandaid on the area. Keep clean and dry. May apply Neosporin. Monitor for signs of infection--redness, swelling, fever, drainage from the area and f/u if signs of infection with our clinic or PCP

## 2019-04-10 ENCOUNTER — Other Ambulatory Visit: Payer: Self-pay

## 2019-04-10 ENCOUNTER — Ambulatory Visit
Admission: EM | Admit: 2019-04-10 | Discharge: 2019-04-10 | Disposition: A | Discharge status: Home / Self Care | Payer: 59 | Attending: Family Medicine | Admitting: Family Medicine

## 2019-04-10 ENCOUNTER — Encounter: Payer: Self-pay | Admitting: Emergency Medicine

## 2019-04-10 DIAGNOSIS — W5501XA Bitten by cat, initial encounter: Secondary | ICD-10-CM

## 2019-04-10 DIAGNOSIS — S81832A Puncture wound without foreign body, left lower leg, initial encounter: Secondary | ICD-10-CM

## 2019-04-10 MED ORDER — AMOXICILLIN-POT CLAVULANATE 875-125 MG PO TABS: 1.0000 | ORAL_TABLET | Freq: Two times a day (BID) | ORAL | 0 refills | Status: AC

## 2019-04-10 NOTE — ED Triage Notes (Addendum)
Patient c/o cat bite to left lower leg last night from her cat. Cat is up to date on all vaccinations. Tetanus up to date

## 2019-04-10 NOTE — ED Provider Notes (Signed)
[address], [address] [place], [place] [address] [phone]   Name: Amber ModeDeborah S Mayer DOB: 1966-10-26 MRN: [id number] CSN: [id number]  Arrival date and time:  04/10/19 1152  Chief Complaint:  Animal Bite  NOTE: Prior to seeing the patient today, I have reviewed the triage nursing documentation and vital signs. Clinical staff has updated patient's PMH/PSHx, current medication list, and drug allergies/intolerances to ensure comprehensive history available to assist in medical decision making.   History:   HPI: Amber Mayer is a 53 y.o. female who presents today with complaints of LEFT lower leg pain after being bitten by her cat last night (04/09/2019) at around 1800. Patient notes that she was backing up and accidentally stepped on her cat, which caused the animal to bite her. Patient presents with 3 small puncture would to the posterior aspect of her distal LEFT lower extremity. Areas are draining. Wounds are tender to touch.   Animal is UTD on all of its vaccinations. Patient's last tetanus shot was in 09/2018.  PCP: System, Pcp Not In  Past Medical History:  Diagnosis Date   Anemia    Borderline diabetes    Thyroid disease     Past Surgical History:  Procedure Laterality Date   cesarian section     4x   COMBINED HYSTEROSCOPY DIAGNOSTIC / D&C     HYSTEROSCOPY     TUBAL LIGATION      Family History  Problem Relation Age of Onset   Diabetes Mother    Hypertension Mother    Prostate cancer Father    Breast cancer Neg Hx     Social History   Socioeconomic History   Marital status: Married    Spouse name: Not on file   Number of children: Not on file   Years of education: Not on file   Highest education level: Not on file  Occupational History   Not on file  Social Needs   Financial resource strain: Not on file   Food insecurity:    Worry: Not on file    Inability: Not on file   Transportation needs:    Medical: Not on file   Non-medical: Not on file  Tobacco Use   Smoking status: Never Smoker   Smokeless tobacco: Never Used  Substance and Sexual Activity   Alcohol use: No   Drug use: Never   Sexual activity: Yes    Birth control/protection: None, Surgical  Lifestyle   Physical activity:    Days per week: Not on file    Minutes per session: Not on file   Stress: Not on file  Relationships   Social connections:    Talks on phone: Not on file    Gets together: Not on file    Attends religious service: Not on file    Active member of club or organization: Not on file    Attends meetings of clubs or organizations: Not on file    Relationship status: Not on file   Intimate partner violence:    Fear of current or ex partner: Not on file    Emotionally abused: Not on file    Physically abused: Not on file    Forced sexual activity: Not on file  Other Topics Concern   Not on file  Social History Narrative   Not on file    There are no active problems to display for this patient.   Home Medications:    Current Meds  Medication Sig   ARMOUR THYROID 120  MG tablet    Vitamin D, Ergocalciferol, (DRISDOL) 50000 units CAPS capsule Take 50,000 Units by mouth every 7 (seven) days.    Allergies:   Erythromycin  Review of Systems (ROS): Review of Systems  Constitutional: Negative for chills and fever.  Musculoskeletal: Negative for back pain.  Skin: Positive for wound (LLE).  All other systems reviewed and are negative.    Physical Exam:  Triage Vital Signs ED Triage Vitals  Enc Vitals Group     BP --      Pulse Rate 04/10/19 1200 93     Resp 04/10/19 1200 18     Temp 04/10/19 1200 97.8 F (36.6 C)     Temp Source 04/10/19 1200 Oral     SpO2 04/10/19 1200 98 %     Weight 04/10/19 1158 230 lb (104.3 kg)     Height 04/10/19 1158  (1.651 m)     Head Circumference --      Peak Flow --      Pain Score 04/10/19 1158 4     Pain Loc --      Pain Edu? --      Excl. in GC? --      Physical Exam  Constitutional: She is oriented to person, place, and time and well-developed, well-nourished, and in no distress.  HENT:  Head: Normocephalic and atraumatic.  Mouth/Throat: Mucous membranes are normal.  Cardiovascular: Normal rate, regular rhythm, normal heart sounds and intact distal pulses. Exam reveals no gallop and no friction rub.  No murmur heard. Pulmonary/Chest: Effort normal and breath sounds normal. No respiratory distress. She has no wheezes. She has no rales.  Neurological: She is alert and oriented to person, place, and time.  Skin: Skin is warm and dry.  Puncture wounds x 3 from cat bites. (+) peri-wound erythema; no lymphangitic spread. (+) tenderness to palpation. Areas with (+) serosanguinous drainage. See medical photo below.   Psychiatric: Mood, affect and judgment normal.  Nursing note and vitals reviewed.     Urgent Care Treatments / Results:   LABS: PLEASE NOTE: all labs that were ordered this encounter are listed, however only abnormal results are displayed. Labs Reviewed - No data to display  EKG: No orders found for this or any previous visit.  RADIOLOGY: No results found.  PRODEDURES: Procedures  MEDICATIONS RECEIVED THIS VISIT: Medications - No data to display  PERTINENT CLINICAL COURSE NOTES/UPDATES: No data to display   Initial Impression / Assessment and Plan / Urgent Care Course:    Amber Mayer is a 53 y.o. female who presents to [facility] today with complaints of Animal Bite  Pertinent labs & imaging results that were available during my care of the patient were personally reviewed by me and considered in my medical decision making (see lab/imaging section of note for values and interpretations).  Exam revealed 3 puncture wounds to posterior aspect of distal LEFT lower extremity (see medical photo). Areas with peri-wound erythema. No lymphangitic spread. Areas are tender to palpation. Animal UTD on  vaccinations. Patient UTD on tetanus vaccine. This animal lives outdoors.  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 Will cover with a 10 day course of Augmentin BID. May use Tylenol and/or Ibuprofen as needed for pain/fever. Patient to apply TAO and keep areas covered. She was educated on the signs and symptoms of infection.   Discussed follow up with primary care physician this week for  re-evaluation. I have reviewed the follow up and strict return precautions for any new or worsening symptoms. Patient is aware of symptoms that would be deemed urgent/emergent, and would thus require further evaluation either here or in the emergency department. At the time of discharge, she verbalized understanding and consent with the discharge plan as it was reviewed with her. All questions were fielded by provider and/or clinic staff prior to patient discharge.    Final Clinical Impressions(s) / Urgent Care Diagnoses:   Final diagnoses:  Cat bite, initial encounter  Puncture wound of left lower leg, initial encounter    New Prescriptions:   Meds ordered this encounter  Medications   amoxicillin-clavulanate (AUGMENTIN) 875-125 MG tablet    Sig: Take 1 tablet by mouth 2 (two) times daily for 10 days.    Dispense:  20 tablet    Refill:  0    Controlled Substance Prescriptions:  [place] Controlled Substance Registry consulted? No  NOTE: This note was prepared using Scientist, clinical (histocompatibility and immunogenetics) along with smaller Lobbyist. Despite my best ability to proofread, there is the potential that transcriptional errors may still occur from this process, and are completely unintentional.     Verlee Monte, NP 04/10/19 1232

## 2019-04-10 NOTE — Discharge Instructions (Addendum)
It was very nice meeting you today in clinic. Thank you for entrusting me with your care.   As discussed, cat bites carry an increased of infection. Will cover you with a course of antibiotics.  Please utilize the medications that we discussed. Your prescriptions have been called in to your pharmacy.  Keep areas clean and dry. Apply antibiotic ointment (Neosporin) and keep covered while draining.  Monitor for signs of infection (increased redness, streaking, warmth, pus, fever). May use Tylenol and/or Ibuprofen as needed for pain/fever.   Make arrangements to follow up with your regular doctor in 1 week for re-evaluation. If your symptoms/condition worsens, please seek follow up care either here or in the ER. Please remember, our [facility] providers are "right here with you" when you need [place].   Again, it was my pleasure to take care of you today. Thank you for choosing our clinic. I hope that you start to feel better quickly.   Quentin Mulling, MSN, APRN, FNP-C, CEN Advanced Practice Provider [facility]

## 2020-09-02 ENCOUNTER — Encounter: Payer: Self-pay | Admitting: Cardiology

## 2020-11-09 ENCOUNTER — Encounter: Payer: Self-pay | Admitting: *Deleted

## 2020-11-12 ENCOUNTER — Other Ambulatory Visit: Payer: Self-pay

## 2020-11-12 ENCOUNTER — Encounter: Payer: Self-pay | Admitting: Cardiology

## 2020-11-12 ENCOUNTER — Ambulatory Visit (INDEPENDENT_AMBULATORY_CARE_PROVIDER_SITE_OTHER): Payer: 59 | Admitting: Cardiology

## 2020-11-12 VITALS — BP 150/90 | HR 87 | Ht 65.0 in | Wt 271.0 lb

## 2020-11-12 DIAGNOSIS — R002 Palpitations: Secondary | ICD-10-CM

## 2020-11-12 NOTE — Progress Notes (Signed)
Cardiology Office Note:    Date:  11/12/2020   ID:  Amber Mayer, DOB [birth year], MRN [id number]  PCP:  Pcp, No  [facility] Cardiologist:  No primary care provider on file.  [facility] Electrophysiologist:  None   Referring MD: Alan Mulder, MD   Chief Complaint  Patient presents with  . New Patient (Initial Visit)    Referred by Dr. Patrecia Pace to discuss abnormal ZIO monitor results. Patient reports palpitations that have become more frequently recently. She also notes that her symptoms have improved since having thyroid medication adjusted; Meds verbally reviewed with patient.   Amber Mayer is a 54 y.o. female who is being seen today for the evaluation of abnormal cardiac monitor at the request of Morayati, Delsa Sale, MD.   History of Present Illness:    Amber Mayer is a 54 y.o. female with a hx of anxiety, diabetes, hypothyroidism, who presents with palpitations.  She has a history of hypothyroidism.  Was not compliant with her thyroid medications at the onset of palpitations.  Thyroid function testing showed abnormal thyroid function.  She resumed taking her thyroid meds as prescribed with improvement in her symptoms of palpitations.  A cardiac monitor was placed to evaluate palpitations by her primary care provider.  The recording did mention possible SVT.  Recommendation was made for patient to see cardiology.  She currently has occasional skipped heartbeats otherwise feels well.  Only episodes of rapid heart rates occur after a bad dream or whenever patient is anxious.  Past Medical History:  Diagnosis Date  . Allergic rhinitis   . Anemia   . Anxiety disorder   . Borderline diabetes   . Dependent edema   . Diabetes mellitus without complication (HCC)   . Exogenous obesity   . Familial hypercholesteremia   . Hashimoto's thyroiditis   . Hyperlipidemia   . Hypertension   . Normochromic anemia   . Thyroid disease   . Vitamin B12 deficiency   .  Vitamin D deficiency     Past Surgical History:  Procedure Laterality Date  . cesarian section     4x  . COMBINED HYSTEROSCOPY DIAGNOSTIC / D&C    . HYSTEROSCOPY    . TUBAL LIGATION      Current Medications: Current Meds  Medication Sig  . Cyanocobalamin (B-12 COMPLIANCE INJECTION IJ) Inject as directed every 30 (thirty) days.  Marland Kitchen levothyroxine (SYNTHROID) 75 MCG tablet Take 75 mcg by mouth daily before breakfast.  . triamcinolone (KENALOG) 0.1 % Apply 1 application topically as needed.  . Vitamin D, Ergocalciferol, (DRISDOL) 50000 units CAPS capsule Take 50,000 Units by mouth. Twice weekly.     Allergies:   Erythromycin and Macrolides and ketolides   Social History   Socioeconomic History  . Marital status: Married    Spouse name: Not on file  . Number of children: Not on file  . Years of education: Not on file  . Highest education level: Not on file  Occupational History  . Not on file  Tobacco Use  . Smoking status: Never Smoker  . Smokeless tobacco: Never Used  Vaping Use  . Vaping Use: Never used  Substance and Sexual Activity  . Alcohol use: No  . Drug use: Never  . Sexual activity: Yes    Birth control/protection: None, Surgical  Other Topics Concern  . Not on file  Social History Narrative  . Not on file   Social Determinants of Health   Financial Resource Strain:   .  Difficulty of Paying Living Expenses: Not on file  Food Insecurity:   . Worried About Programme researcher, broadcasting/film/video in the Last Year: Not on file  . Ran Out of Food in the Last Year: Not on file  Transportation Needs:   . Lack of Transportation (Medical): Not on file  . Lack of Transportation (Non-Medical): Not on file  Physical Activity:   . Days of Exercise per Week: Not on file  . Minutes of Exercise per Session: Not on file  Stress:   . Feeling of Stress : Not on file  Social Connections:   . Frequency of Communication with Friends and Family: Not on file  . Frequency of Social  Gatherings with Friends and Family: Not on file  . Attends Religious Services: Not on file  . Active Member of Clubs or Organizations: Not on file  . Attends Banker Meetings: Not on file  . Marital Status: Not on file     Family History: The patient's family history includes Diabetes in her mother; Hypertension in her mother; Prostate cancer in her father. There is no history of Breast cancer.  ROS:   Please see the history of present illness.     All other systems reviewed and are negative.  EKGs/Labs/Other Studies Reviewed:    The following studies were reviewed today:   EKG:  EKG is  ordered today.  The ekg ordered today demonstrates normal sinus rhythm  Recent Labs: No results found for requested labs within last 8760 hours.  Recent Lipid Panel No results found for: CHOL, TRIG, HDL, CHOLHDL, VLDL, LDLCALC, LDLDIRECT   Risk Assessment/Calculations:      Physical Exam:    VS:  BP (!) 150/90 (BP Location: Right Arm, Patient Position: Sitting, Cuff Size: Large)   Pulse 87   Ht 5\' 5"  (1.651 m)   Wt 271 lb (122.9 kg)   SpO2 95%   BMI 45.10 kg/m     Wt Readings from Last 3 Encounters:  11/12/20 271 lb (122.9 kg)  04/10/19 230 lb (104.3 kg)  10/02/18 250 lb (113.4 kg)     GEN:  Well nourished, well developed in no acute distress HEENT: Normal NECK: No JVD; No carotid bruits LYMPHATICS: No lymphadenopathy CARDIAC: RRR, no murmurs, rubs, gallops RESPIRATORY:  Clear to auscultation without rales, wheezing or rhonchi  ABDOMEN: Soft, non-tender, non-distended MUSCULOSKELETAL:  No edema; No deformity  SKIN: Warm and dry NEUROLOGIC:  Alert and oriented x 3 PSYCHIATRIC:  Normal affect   ASSESSMENT:    1. Palpitations   2. Morbid obesity (HCC)    PLAN:    In order of problems listed above:  1. Patient with history of palpitations, resolved after resuming diuretic medications as prescribed.  11-day cardiac monitor reviewed by myself showing  occasional SVE's, PVCs all less than 1% burden.  Report of SVTs were actually artifactual upon my review.  Patient made aware of results, overall benign cardiac monitor with no significant arrhythmias.  As patient is asymptomatic, we will not start a medication at this time.  Patient also advised to adhere to thyroid medications as prescribed. 2. Patient is morbidly obese, low-calorie diet, weight loss advised.  Over 5 minutes spent counseling patient.  Follow-up as needed    Shared Decision Making/Informed Consent       Medication Adjustments/Labs and Tests Ordered: Current medicines are reviewed at length with the patient today.  Concerns regarding medicines are outlined above.  Orders Placed This Encounter  Procedures  .  EKG 12-Lead   No orders of the defined types were placed in this encounter.   Patient Instructions  Medication Instructions:  Your physician recommends that you continue on your current medications as directed. Please refer to the Current Medication list given to you today.  *If you need a refill on your cardiac medications before your next appointment, please call your pharmacy*   Lab Work: None Ordered If you have labs (blood work) drawn today and your tests are completely normal, you will receive your results only by: Marland Kitchen MyChart Message (if you have MyChart) OR . A paper copy in the mail If you have any lab test that is abnormal or we need to change your treatment, we will call you to review the results.   Testing/Procedures: None Ordered   Follow-Up: At [facility], you and your health needs are our priority.  As part of our continuing mission to provide you with exceptional heart care, we have created designated Provider Care Teams.  These Care Teams include your primary Cardiologist (physician) and Advanced Practice Providers (APPs -  Physician Assistants and Nurse Practitioners) who all work together to provide you with the care you need, when you  need it.  We recommend signing up for the patient portal called "MyChart".  Sign up information is provided on this After Visit Summary.  MyChart is used to connect with patients for Virtual Visits (Telemedicine).  Patients are able to view lab/test results, encounter notes, upcoming appointments, etc.  Non-urgent messages can be sent to your provider as well.   To learn more about what you can do with MyChart, go to [email or web address].    Your next appointment:   Follow up as needed   The format for your next appointment:   In Person  Provider:   Debbe Odea, MD   Other Instructions      Signed, Debbe Odea, MD  11/12/2020 10:36 AM    [facility]

## 2020-11-12 NOTE — Patient Instructions (Signed)

## 2021-02-09 ENCOUNTER — Telehealth: Payer: Self-pay

## 2021-02-09 NOTE — Telephone Encounter (Signed)
EKG from 09/02/2020 was sent fax from [facility]. EKG sent to Dr. Mariah Milling for review then will be scanned into pt's chart

## 2021-03-15 ENCOUNTER — Encounter: Payer: Self-pay | Admitting: Obstetrics & Gynecology

## 2021-03-15 ENCOUNTER — Other Ambulatory Visit: Payer: Self-pay

## 2021-03-15 ENCOUNTER — Other Ambulatory Visit (HOSPITAL_COMMUNITY)
Admission: RE | Admit: 2021-03-15 | Discharge: 2021-03-15 | Disposition: A | Discharge status: Home / Self Care | Payer: 59 | Source: Ambulatory Visit | Attending: Obstetrics & Gynecology | Admitting: Obstetrics & Gynecology

## 2021-03-15 ENCOUNTER — Ambulatory Visit (INDEPENDENT_AMBULATORY_CARE_PROVIDER_SITE_OTHER): Payer: 59 | Admitting: Obstetrics & Gynecology

## 2021-03-15 VITALS — BP 142/82 | Ht 65.0 in | Wt 268.0 lb

## 2021-03-15 DIAGNOSIS — Z1211 Encounter for screening for malignant neoplasm of colon: Secondary | ICD-10-CM

## 2021-03-15 DIAGNOSIS — Z1231 Encounter for screening mammogram for malignant neoplasm of breast: Secondary | ICD-10-CM | POA: Diagnosis not present

## 2021-03-15 DIAGNOSIS — Z124 Encounter for screening for malignant neoplasm of cervix: Secondary | ICD-10-CM

## 2021-03-15 DIAGNOSIS — Z01419 Encounter for gynecological examination (general) (routine) without abnormal findings: Secondary | ICD-10-CM

## 2021-03-15 MED ORDER — CLOTRIMAZOLE-BETAMETHASONE 1-0.05 % EX CREA: 1.0000 "application " | TOPICAL_CREAM | Freq: Two times a day (BID) | CUTANEOUS | 0 refills | Status: DC

## 2021-03-15 NOTE — Progress Notes (Signed)
HPI:      [name] is a 55 y.o. [id number] who LMP was Patient's last menstrual period was 09/20/2020., she presents today for her annual examination. The patient has no complaints today. She has had 2 periods in the last year; rare hor flash.  The patient is sexually active. Her last pap: approximate date 2019 and was normal and last mammogram: approximate date 2019 and was normal. The patient does perform self breast exams.  There is no notable family history of breast or ovarian cancer in her family.  The patient has regular exercise: yes.  The patient denies current symptoms of depression.    GYN History: Contraception: tubal ligation  PMHx: Past Medical History:  Diagnosis Date  . Allergic rhinitis   . Anemia   . Anxiety disorder   . Borderline diabetes   . Dependent edema   . Diabetes mellitus without complication (HCC)   . Exogenous obesity   . Familial hypercholesteremia   . Hashimoto's thyroiditis   . Hyperlipidemia   . Hypertension   . Normochromic anemia   . Thyroid disease   . Vitamin B12 deficiency   . Vitamin D deficiency    Past Surgical History:  Procedure Laterality Date  . cesarian section     4x  . COMBINED HYSTEROSCOPY DIAGNOSTIC / D&C    . HYSTEROSCOPY    . TUBAL LIGATION     Family History  Problem Relation Age of Onset  . Diabetes Mother   . Hypertension Mother   . Lung cancer Mother 59  . Prostate cancer Father   . Breast cancer Neg Hx    Social History   Tobacco Use  . Smoking status: Never Smoker  . Smokeless tobacco: Never Used  Vaping Use  . Vaping Use: Never used  Substance Use Topics  . Alcohol use: No  . Drug use: Never    Current Outpatient Medications:  .  Cyanocobalamin (B-12 COMPLIANCE INJECTION IJ), Inject as directed every 30 (thirty) days., Disp: , Rfl:  .  NP THYROID 90 MG tablet, Take 90 mg by mouth daily., Disp: , Rfl:  .  Vitamin D, Ergocalciferol, (DRISDOL) 50000 units CAPS capsule, Take 50,000 Units by  mouth. Twice weekly., Disp: , Rfl:  .  levothyroxine (SYNTHROID) 75 MCG tablet, Take 75 mcg by mouth daily before breakfast. (Patient not taking: Reported on 03/15/2021), Disp: , Rfl:  .  triamcinolone (KENALOG) 0.1 %, Apply 1 application topically as needed. (Patient not taking: Reported on 03/15/2021), Disp: , Rfl:  Allergies: Erythromycin and Macrolides and ketolides  Review of Systems  Constitutional: Positive for malaise/fatigue. Negative for chills and fever.  HENT: Negative for congestion, sinus pain and sore throat.   Eyes: Negative for blurred vision and pain.  Respiratory: Negative for cough and wheezing.   Cardiovascular: Negative for chest pain and leg swelling.  Gastrointestinal: Positive for constipation and diarrhea. Negative for abdominal pain, heartburn, nausea and vomiting.  Genitourinary: Negative for dysuria, frequency, hematuria and urgency.  Musculoskeletal: Negative for back pain, joint pain, myalgias and neck pain.  Skin: Negative for itching and rash.  Neurological: Positive for dizziness, weakness and headaches. Negative for tremors.  Endo/Heme/Allergies: Does not bruise/bleed easily.  Psychiatric/Behavioral: Negative for depression. The patient is not nervous/anxious and does not have insomnia.     Objective: BP (!) 142/82   Ht 5\' 5"  (1.651 m)   Wt 268 lb (121.6 kg)   LMP 09/20/2020   BMI 44.60 kg/m   Filed  Weights   03/15/21 1320  Weight: 268 lb (121.6 kg)   Body mass index is 44.6 kg/m. Physical Exam Constitutional:      General: She is not in acute distress.    Appearance: She is well-developed. She is obese.  Genitourinary:     Bladder and rectum normal.     No lesions in the vagina.     No vaginal bleeding.     No vaginal atrophy present.     Right Adnexa: not tender and no mass present.    Left Adnexa: not tender and no mass present.    No cervical motion tenderness, friability, lesion or polyp.     Uterus is not enlarged.     No uterine mass  detected.    Uterus is midaxial.     Pelvic exam was performed with patient in the lithotomy position.  Breasts:     Right: No mass, skin change or tenderness.     Left: No mass, skin change or tenderness.    HENT:     Head: Normocephalic and atraumatic. No laceration.     Right Ear: Hearing normal.     Left Ear: Hearing normal.     Mouth/Throat:     Pharynx: Uvula midline.  Eyes:     Pupils: Pupils are equal, round, and reactive to light.  Neck:     Thyroid: No thyromegaly.  Cardiovascular:     Rate and Rhythm: Normal rate and regular rhythm.     Heart sounds: No murmur heard. No friction rub. No gallop.   Pulmonary:     Effort: Pulmonary effort is normal. No respiratory distress.     Breath sounds: Normal breath sounds. No wheezing.  Abdominal:     General: Bowel sounds are normal. There is no distension.     Palpations: Abdomen is soft.     Tenderness: There is no abdominal tenderness. There is no rebound.  Musculoskeletal:        General: Normal range of motion.     Cervical back: Normal range of motion and neck supple.  Neurological:     Mental Status: She is alert and oriented to person, place, and time.     Cranial Nerves: No cranial nerve deficit.  Skin:    General: Skin is warm and dry.  Psychiatric:        Judgment: Judgment normal.  Vitals reviewed.     Assessment:  ANNUAL EXAM 1. Women's annual routine gynecological examination   2. Screening for cervical cancer   3. Encounter for screening mammogram for malignant neoplasm of breast   4. Screen for colon cancer      Screening Plan:            1.  Cervical Screening-  Pap smear done today  2. Breast screening- Exam annually and mammogram>40 planned   3. Colonoscopy every 10 years, Hemoccult testing - after age 59  4. Labs managed by PCP (Dr Kerrie Pleasure)  5. Lotrisone for heat rash that helps when occas episodes occur     F/U  Return in about 1 year (around 03/15/2022) for Annual.  Annamarie Major,  MD, [facility], [facility] 03/15/2021  1:49 PM

## 2021-03-15 NOTE — Patient Instructions (Signed)
PAP every three years Mammogram every year    Call [phone] to schedule at [facility]) Colonoscopy every 10 years, someone will call to arrange Labs yearly (with PCP)  Thank you for choosing [facility]. As part of our ongoing efforts to improve patient experience, we would appreciate your feedback. Please fill out the short survey that you will receive by mail or MyChart. Your opinion is important to [place]! - Dr. Tiburcio Pea

## 2021-03-18 LAB — CYTOLOGY - PAP
Comment: NEGATIVE
Diagnosis: NEGATIVE
High risk HPV: NEGATIVE

## 2021-03-31 ENCOUNTER — Ambulatory Visit: Payer: 59

## 2021-04-05 ENCOUNTER — Ambulatory Visit (INDEPENDENT_AMBULATORY_CARE_PROVIDER_SITE_OTHER): Payer: 59 | Admitting: Gastroenterology

## 2021-04-05 ENCOUNTER — Encounter: Payer: Self-pay | Admitting: Gastroenterology

## 2021-04-05 ENCOUNTER — Other Ambulatory Visit: Payer: Self-pay

## 2021-04-05 VITALS — BP 142/86 | HR 108 | Temp 97.8°F | Ht 65.0 in | Wt 264.4 lb

## 2021-04-05 DIAGNOSIS — R1310 Dysphagia, unspecified: Secondary | ICD-10-CM

## 2021-04-05 DIAGNOSIS — K5900 Constipation, unspecified: Secondary | ICD-10-CM

## 2021-04-05 DIAGNOSIS — R1319 Other dysphagia: Secondary | ICD-10-CM | POA: Diagnosis not present

## 2021-04-05 DIAGNOSIS — Z1211 Encounter for screening for malignant neoplasm of colon: Secondary | ICD-10-CM

## 2021-04-05 MED ORDER — NA SULFATE-K SULFATE-MG SULF 17.5-3.13-1.6 GM/177ML PO SOLN: ORAL | 0 refills | Status: AC

## 2021-04-05 MED ORDER — NA SULFATE-K SULFATE-MG SULF 17.5-3.13-1.6 GM/177ML PO SOLN: ORAL | 0 refills | Status: DC

## 2021-04-05 NOTE — Progress Notes (Signed)
Amber Mayer [address]  [address]  [place], [place] [address]  Main: [phone]  Fax: [phone]   Gastroenterology Consultation  Referring Provider:     Nadara Mustard, MD Primary Care Physician:  Alan Mulder, MD Reason for Consultation:     Constipation        HPI:    Chief Complaint  Patient presents with  . Constipation    Amber Mayer is a 55 y.o. y/o female referred for consultation & management  by Dr. Patrecia Pace, Delsa Sale, MD.  Patient reports chronic history of constipation, but states is the pain to take anything at this she states that she takes stool softener, she goes the other and has diarrhea.  Also reports gas pain associated with this.  No nausea or vomiting or abdominal pain.  Has never had a screening colonoscopy.  Describes having a sigmoidoscopy in the office 10 years ago as she stated she had bright red blood per rectum at that time was found to have hemorrhoids.  No family history of colon cancer.  Describes having 2 to 3 days without a bowel movement, and then having 1-2 formed stools subsequently.  Also describes solid food dysphagia for at least 6 months.  No prior upper endoscopy.  No episodes of food impaction  Past Medical History:  Diagnosis Date  . Allergic rhinitis   . Anemia   . Anxiety disorder   . Borderline diabetes   . Dependent edema   . Diabetes mellitus without complication (HCC)   . Exogenous obesity   . Familial hypercholesteremia   . Hashimoto's thyroiditis   . Hyperlipidemia   . Hypertension   . Normochromic anemia   . Thyroid disease   . Vitamin B12 deficiency   . Vitamin D deficiency     Past Surgical History:  Procedure Laterality Date  . cesarian section     4x  . COMBINED HYSTEROSCOPY DIAGNOSTIC / D&C    . HYSTEROSCOPY    . TUBAL LIGATION      Prior to Admission medications   Medication Sig Start Date End Date Taking? Authorizing Provider  levothyroxine (SYNTHROID) 75 MCG tablet  Take 75 mcg by mouth daily before breakfast.   Yes [provider]  Na Sulfate-K Sulfate-Mg Sulf 17.5-3.13-1.6 GM/177ML SOLN At 5 PM the day before procedure take 1 bottle and 5 hours before procedure take 1 bottle. 04/05/21  Yes Pasty Spillers, MD  Vitamin D, Ergocalciferol, (DRISDOL) 50000 units CAPS capsule Take 50,000 Units by mouth. Twice weekly.   Yes [provider]    Family History  Problem Relation Age of Onset  . Diabetes Mother   . Hypertension Mother   . Lung cancer Mother 62  . Prostate cancer Father   . Breast cancer Neg Hx      Social History   Tobacco Use  . Smoking status: Never Smoker  . Smokeless tobacco: Never Used  Vaping Use  . Vaping Use: Never used  Substance Use Topics  . Alcohol use: No  . Drug use: Never    Allergies as of 04/05/2021 - Review Complete 04/05/2021  Allergen Reaction Noted  . Erythromycin Nausea And Vomiting 04/19/2015  . Macrolides and ketolides  11/09/2020    Review of Systems:    All systems reviewed and negative except where noted in HPI.   Physical Exam:   Vitals:   04/05/21 1548  BP: (!) 142/86  Pulse: (!) 108  Temp: 97.8 F (36.6  C)  TempSrc: Oral  Weight: 264 lb 6.4 oz (119.9 kg)  Height: 5\' 5"  (1.651 m)    No LMP recorded. (Menstrual status: Perimenopausal). Psych:  Alert and cooperative. Normal mood and affect. General:   Alert,  Well-developed, well-nourished, pleasant and cooperative in NAD Head:  Normocephalic and atraumatic. Eyes:  Sclera clear, no icterus.   Conjunctiva pink. Ears:  Normal auditory acuity. Nose:  No deformity, discharge, or lesions. Mouth:  No deformity or lesions,oropharynx pink & moist. Neck:  Supple; no masses or thyromegaly. Abdomen:  Normal bowel sounds.  No bruits.  Soft, non-tender and non-distended without masses, hepatosplenomegaly or hernias noted.  No guarding or rebound tenderness.    Msk:  Symmetrical without gross deformities. Good, equal movement &  strength bilaterally. Pulses:  Normal pulses noted. Extremities:  No clubbing or edema.  No cyanosis. Neurologic:  Alert and oriented x3;  grossly normal neurologically. Skin:  Intact without significant lesions or rashes. No jaundice. Lymph Nodes:  No significant cervical adenopathy. Psych:  Alert and cooperative. Normal mood and affect.     Assessment and Plan:   [name] is a 55 y.o. y/o female has been referred for constipation and need for screening colonoscopy  Patient describes having 2 to 3 days without, followed by loose bowel movements and having abdominal cramping and gas during the days without bowel movements.  Would recommend Metamucil as that would help with her constipation and avoid loose stools that was experiencing with other stool softeners OTC   Patient is due for screening colonoscopy and will schedule with 2 day prep  She is also reporting solid food dysphagia and EGD is indicated for further evaluation  I have discussed alternative options, risks & benefits,  which include, but are not limited to, bleeding, infection, perforation,respiratory complication & drug reaction.  The patient agrees with this plan & written consent will be obtained.    Alternative options of conservative management were discussed in detail, including but not limited to medication management, foregoing endoscopic procedures at this time and others.     Dr 40  Speech recognition software was used to dictate the above note.

## 2021-04-22 ENCOUNTER — Encounter: Payer: Self-pay | Admitting: Gastroenterology

## 2021-04-28 NOTE — Discharge Instructions (Signed)

## 2021-05-01 NOTE — Anesthesia Preprocedure Evaluation (Addendum)
Anesthesia Evaluation  Patient identified by MRN, date of birth, ID band Patient awake    Reviewed: Allergy & Precautions, NPO status , Patient's Chart, lab work & pertinent test results  History of Anesthesia Complications Negative for: history of anesthetic complications  Airway Mallampati: III   Neck ROM: Full    Dental no notable dental hx.    Pulmonary neg pulmonary ROS,    Pulmonary exam normal breath sounds clear to auscultation       Cardiovascular Exercise Tolerance: Good Normal cardiovascular exam Rhythm:Regular Rate:Normal  ECG 11/12/20: NSR   Neuro/Psych PSYCHIATRIC DISORDERS Anxiety negative neurological ROS     GI/Hepatic negative GI ROS,   Endo/Other  diabetes, Type 2Hypothyroidism Class 3 obesity  Renal/GU negative Renal ROS     Musculoskeletal   Abdominal   Peds  Hematology  (+) Blood dyscrasia, anemia ,   Anesthesia Other Findings Cardiology note 11/12/20:  1. Patient with history of palpitations, resolved after resuming diuretic medications as prescribed.  11-day cardiac monitor reviewed by myself showing occasional SVE's, PVCs all less than 1% burden.  Report of SVTs were actually artifactual upon my review.  Patient made aware of results, overall benign cardiac monitor with no significant arrhythmias.  As patient is asymptomatic, we will not start a medication at this time.  Patient also advised to adhere to thyroid medications as prescribed. 2. Patient is morbidly obese, low-calorie diet, weight loss advised.  Over 5 minutes spent counseling patient.   Reproductive/Obstetrics                            Anesthesia Physical Anesthesia Plan  ASA: III  Anesthesia Plan: General   Post-op Pain Management:    Induction: Intravenous  PONV Risk Score and Plan: 3 and Propofol infusion, TIVA and Treatment may vary due to age or medical condition  Airway Management Planned:  Natural Airway  Additional Equipment:   Intra-op Plan:   Post-operative Plan:   Informed Consent: I have reviewed the patients History and Physical, chart, labs and discussed the procedure including the risks, benefits and alternatives for the proposed anesthesia with the patient or authorized representative who has indicated his/her understanding and acceptance.       Plan Discussed with: CRNA  Anesthesia Plan Comments:        Anesthesia Quick Evaluation

## 2021-05-03 ENCOUNTER — Ambulatory Visit: Payer: 59 | Admitting: Anesthesiology

## 2021-05-03 ENCOUNTER — Encounter
Admission: RE | Disposition: A | Discharge status: Home / Self Care | Payer: Self-pay | Source: Ambulatory Visit | Attending: Gastroenterology

## 2021-05-03 ENCOUNTER — Other Ambulatory Visit: Payer: Self-pay

## 2021-05-03 ENCOUNTER — Ambulatory Visit
Admission: RE | Admit: 2021-05-03 | Discharge: 2021-05-03 | Disposition: A | Discharge status: Home / Self Care | Payer: 59 | Source: Ambulatory Visit | Attending: Gastroenterology | Admitting: Gastroenterology

## 2021-05-03 ENCOUNTER — Encounter: Payer: Self-pay | Admitting: Gastroenterology

## 2021-05-03 DIAGNOSIS — K3189 Other diseases of stomach and duodenum: Secondary | ICD-10-CM | POA: Insufficient documentation

## 2021-05-03 DIAGNOSIS — R1310 Dysphagia, unspecified: Secondary | ICD-10-CM | POA: Diagnosis not present

## 2021-05-03 DIAGNOSIS — Z833 Family history of diabetes mellitus: Secondary | ICD-10-CM | POA: Insufficient documentation

## 2021-05-03 DIAGNOSIS — Z1211 Encounter for screening for malignant neoplasm of colon: Secondary | ICD-10-CM | POA: Insufficient documentation

## 2021-05-03 DIAGNOSIS — Z801 Family history of malignant neoplasm of trachea, bronchus and lung: Secondary | ICD-10-CM | POA: Diagnosis not present

## 2021-05-03 DIAGNOSIS — K639 Disease of intestine, unspecified: Secondary | ICD-10-CM | POA: Diagnosis not present

## 2021-05-03 DIAGNOSIS — Z8042 Family history of malignant neoplasm of prostate: Secondary | ICD-10-CM | POA: Insufficient documentation

## 2021-05-03 DIAGNOSIS — Z881 Allergy status to other antibiotic agents status: Secondary | ICD-10-CM | POA: Diagnosis not present

## 2021-05-03 DIAGNOSIS — K319 Disease of stomach and duodenum, unspecified: Secondary | ICD-10-CM | POA: Diagnosis not present

## 2021-05-03 DIAGNOSIS — Z79890 Hormone replacement therapy (postmenopausal): Secondary | ICD-10-CM | POA: Insufficient documentation

## 2021-05-03 DIAGNOSIS — Z79899 Other long term (current) drug therapy: Secondary | ICD-10-CM | POA: Insufficient documentation

## 2021-05-03 DIAGNOSIS — K2970 Gastritis, unspecified, without bleeding: Secondary | ICD-10-CM | POA: Diagnosis not present

## 2021-05-03 DIAGNOSIS — K6389 Other specified diseases of intestine: Secondary | ICD-10-CM | POA: Diagnosis not present

## 2021-05-03 DIAGNOSIS — Z8249 Family history of ischemic heart disease and other diseases of the circulatory system: Secondary | ICD-10-CM | POA: Diagnosis not present

## 2021-05-03 DIAGNOSIS — E119 Type 2 diabetes mellitus without complications: Secondary | ICD-10-CM | POA: Insufficient documentation

## 2021-05-03 DIAGNOSIS — I10 Essential (primary) hypertension: Secondary | ICD-10-CM | POA: Diagnosis not present

## 2021-05-03 DIAGNOSIS — E063 Autoimmune thyroiditis: Secondary | ICD-10-CM | POA: Insufficient documentation

## 2021-05-03 DIAGNOSIS — K222 Esophageal obstruction: Secondary | ICD-10-CM | POA: Diagnosis not present

## 2021-05-03 HISTORY — PX: ESOPHAGOGASTRODUODENOSCOPY (EGD) WITH PROPOFOL: SHX5813

## 2021-05-03 HISTORY — PX: COLONOSCOPY WITH PROPOFOL: SHX5780

## 2021-05-03 HISTORY — PX: ESOPHAGEAL DILATION: SHX303

## 2021-05-03 SURGERY — ESOPHAGOGASTRODUODENOSCOPY (EGD) WITH PROPOFOL
Anesthesia: General

## 2021-05-03 MED ORDER — OMEPRAZOLE 20 MG PO CPDR: 20.0000 mg | DELAYED_RELEASE_CAPSULE | Freq: Every day | ORAL | 0 refills | Status: DC

## 2021-05-03 MED ORDER — ACETAMINOPHEN 160 MG/5ML PO SOLN: 325.0000 mg | ORAL | Status: DC | PRN

## 2021-05-03 MED ORDER — ONDANSETRON HCL 4 MG/2ML IJ SOLN: 4.0000 mg | Freq: Once | INTRAMUSCULAR | Status: DC | PRN

## 2021-05-03 MED ORDER — STERILE WATER FOR IRRIGATION IR SOLN: Status: DC | PRN

## 2021-05-03 MED ORDER — LIDOCAINE HCL (CARDIAC) PF 100 MG/5ML IV SOSY
PREFILLED_SYRINGE | INTRAVENOUS | Status: DC | PRN
  Administered 2021-05-03: 50 mg via INTRAVENOUS

## 2021-05-03 MED ORDER — PROPOFOL 10 MG/ML IV BOLUS
INTRAVENOUS | Status: DC | PRN
  Administered 2021-05-03: 20 mg via INTRAVENOUS
  Administered 2021-05-03: 30 mg via INTRAVENOUS
  Administered 2021-05-03: 20 mg via INTRAVENOUS
  Administered 2021-05-03: 100 mg via INTRAVENOUS
  Administered 2021-05-03 (×2): 20 mg via INTRAVENOUS
  Administered 2021-05-03: 30 mg via INTRAVENOUS
  Administered 2021-05-03 (×3): 20 mg via INTRAVENOUS
  Administered 2021-05-03: 30 mg via INTRAVENOUS
  Administered 2021-05-03: 20 mg via INTRAVENOUS
  Administered 2021-05-03: 30 mg via INTRAVENOUS
  Administered 2021-05-03 (×5): 20 mg via INTRAVENOUS

## 2021-05-03 MED ORDER — ACETAMINOPHEN 325 MG PO TABS: 650.0000 mg | ORAL_TABLET | Freq: Once | ORAL | Status: DC | PRN

## 2021-05-03 MED ORDER — LACTATED RINGERS IV SOLN: INTRAVENOUS | Status: DC

## 2021-05-03 MED ORDER — SODIUM CHLORIDE 0.9 % IV SOLN: INTRAVENOUS | Status: DC

## 2021-05-03 SURGICAL SUPPLY — 38 items
BALLN DILATOR 10-12 8 (BALLOONS)
BALLN DILATOR 12-15 8 (BALLOONS)
BALLN DILATOR 15-18 8 (BALLOONS)
BALLN DILATOR CRE 0-12 8 (BALLOONS)
BALLN DILATOR CRE 18-20 240 (BALLOONS) ×3 IMPLANT
BALLN DILATOR ESOPH 8 10 CRE (MISCELLANEOUS) IMPLANT
BALLOON DILATOR 12-15 8 (BALLOONS) IMPLANT
BALLOON DILATOR 15-18 8 (BALLOONS) IMPLANT
BALLOON DILATOR CRE 0-12 8 (BALLOONS) IMPLANT
BLOCK BITE 60FR ADLT L/F GRN (MISCELLANEOUS) ×3 IMPLANT
CLIP HMST 235XBRD CATH ROT (MISCELLANEOUS) IMPLANT
CLIP RESOLUTION 360 11X235 (MISCELLANEOUS)
ELECT REM PT RETURN 9FT ADLT (ELECTROSURGICAL)
ELECTRODE REM PT RTRN 9FT ADLT (ELECTROSURGICAL) IMPLANT
FCP ESCP3.2XJMB 240X2.8X (MISCELLANEOUS)
FORCEPS BIOP RAD 4 LRG CAP 4 (CUTTING FORCEPS) ×3 IMPLANT
FORCEPS BIOP RJ4 240 W/NDL (MISCELLANEOUS)
FORCEPS ESCP3.2XJMB 240X2.8X (MISCELLANEOUS) IMPLANT
GOWN CVR UNV OPN BCK APRN NK (MISCELLANEOUS) ×4 IMPLANT
GOWN ISOL THUMB LOOP REG UNIV (MISCELLANEOUS) ×6
INJECTOR VARIJECT VIN23 (MISCELLANEOUS) IMPLANT
KIT DEFENDO VALVE AND CONN (KITS) IMPLANT
KIT PRC NS LF DISP ENDO (KITS) ×2 IMPLANT
KIT PROCEDURE OLYMPUS (KITS) ×3
MANIFOLD NEPTUNE II (INSTRUMENTS) ×3 IMPLANT
MARKER SPOT ENDO TATTOO 5ML (MISCELLANEOUS) IMPLANT
PROBE APC STR FIRE (PROBE) IMPLANT
RETRIEVER NET PLAT FOOD (MISCELLANEOUS) IMPLANT
RETRIEVER NET ROTH 2.5X230 LF (MISCELLANEOUS) IMPLANT
SNARE SHORT THROW 13M SML OVAL (MISCELLANEOUS) IMPLANT
SNARE SHORT THROW 30M LRG OVAL (MISCELLANEOUS) IMPLANT
SNARE SNG USE RND 15MM (INSTRUMENTS) IMPLANT
SPOT EX ENDOSCOPIC TATTOO (MISCELLANEOUS)
SYR INFLATION 60ML (SYRINGE) ×3 IMPLANT
TRAP ETRAP POLY (MISCELLANEOUS) IMPLANT
VARIJECT INJECTOR VIN23 (MISCELLANEOUS)
WATER STERILE IRR 250ML POUR (IV SOLUTION) ×3 IMPLANT
WIRE CRE 18-20MM 8CM F G (MISCELLANEOUS) IMPLANT

## 2021-05-03 NOTE — Op Note (Signed)
[facility] Gastroenterology Patient Name: Amber Mayer Procedure Date: 05/03/2021 8:09 AM MRN: [id number] Account #: [id number] Date of Birth: 06-08-1966 Admit Type: Outpatient Age: 55 Room: [facility] OR ROOM 01 Gender: Female Note Status: Finalized Procedure:             Upper GI endoscopy Indications:           Dysphagia Providers:             Celines Femia B. Maximino Greenland MD, MD Referring MD:          Alan Mulder, MD (Referring MD) Medicines:             Monitored Anesthesia Care Complications:         No immediate complications. Procedure:             Pre-Anesthesia Assessment:                        - Prior to the procedure, a History and Physical was                         performed, and patient medications, allergies and                         sensitivities were reviewed. The patient's tolerance                         of previous anesthesia was reviewed.                        - The risks and benefits of the procedure and the                         sedation options and risks were discussed with the                         patient. All questions were answered and informed                         consent was obtained.                        - Patient identification and proposed procedure were                         verified prior to the procedure by the physician, the                         nurse, the anesthesiologist, the anesthetist and the                         technician. The procedure was verified in the                         procedure room.                        - ASA Grade Assessment: II - A patient with mild                         systemic disease.  After obtaining informed consent, the endoscope was                         passed under direct vision. Throughout the procedure,                         the patient's blood pressure, pulse, and oxygen                         saturations were monitored continuously. The was                          introduced through the mouth, and advanced to the                         second part of duodenum. The upper GI endoscopy was                         accomplished with ease. The patient tolerated the                         procedure well. Findings:      A mild Schatzki ring was found at the gastroesophageal junction. A TTS       dilator was passed through the scope. Dilation with an 18-19-20 mm       balloon dilator was performed to 18 mm. The dilation site was examined       and showed complete resolution of luminal narrowing. Good and expected       heme effect noted after dilation to 20mm.      The exam of the esophagus was otherwise normal.      The examined esophagus was normal. Biopsies were obtained from the       proximal and distal esophagus with cold forceps for histology of       suspected eosinophilic esophagitis.      Patchy mildly erythematous mucosa without bleeding was found in the       gastric antrum. Biopsies were taken with a cold forceps for histology.       Biopsies were obtained in the gastric body, at the incisura and in the       gastric antrum with cold forceps for histology.      The exam of the stomach was otherwise normal.      The duodenal bulb, second portion of the duodenum and examined duodenum       were normal. Impression:            - Mild Schatzki ring. Dilated.                        - Normal esophagus. Biopsied.                        - Erythematous mucosa in the antrum. Biopsied.                        - Normal duodenal bulb, second portion of the duodenum                         and examined duodenum.                        -  Biopsies were obtained in the gastric body, at the                         incisura and in the gastric antrum. Recommendation:        - Take prescribed proton pump inhibitor or H2 blocker                         (antacid) medications 30 - 60 minutes before meals.                        - Follow an  antireflux regimen.                        - Await pathology results.                        - Discharge patient to home (with escort).                        - Advance diet as tolerated.                        - Continue present medications.                        - Patient has a contact number available for                         emergencies. The signs and symptoms of potential                         delayed complications were discussed with the patient.                         Return to normal activities tomorrow. Written                         discharge instructions were provided to the patient.                        - Discharge patient to home (with escort).                        - The findings and recommendations were discussed with                         the patient.                        - The findings and recommendations were discussed with                         the patient's family. Procedure Code(s):     --- Professional ---                        [phone], Esophagogastroduodenoscopy, flexible,                         transoral; with transendoscopic balloon dilation of  esophagus (less than 30 mm diameter)                        43239, 59, Esophagogastroduodenoscopy, flexible,                         transoral; with biopsy, single or multiple Diagnosis Code(s):     --- Professional ---                        K22.2, Esophageal obstruction                        K31.89, Other diseases of stomach and duodenum                        R13.10, Dysphagia, unspecified CPT copyright 2019 [employer]. All rights reserved. The codes documented in this report are preliminary and upon coder review may  be revised to meet current compliance requirements.  Melodie BouillonVarnita Navjot Loera, MD Michel BickersVarnita B. Maximino Greenlandahiliani MD, MD 05/03/2021 8:31:28 AM This report has been signed electronically. Number of Addenda: 0 Note Initiated On: 05/03/2021 8:09 AM Total Procedure  Duration: 0 hours 10 minutes 13 seconds  Estimated Blood Loss:  Estimated blood loss: none.      [facility]

## 2021-05-03 NOTE — Op Note (Signed)
[facility] Patient Name: Mersadies Petree Procedure Date: 05/03/2021 8:07 AM MRN: [id number] Account #: [id number] Date of Birth: February 02, 1966 Admit Type: Outpatient Age: 55 Room: [facility] OR ROOM 01 Gender: Female Note Status: Finalized Procedure:             Colonoscopy Indications:           Screening for colorectal malignant neoplasm Providers:             Lou Irigoyen B. Maximino Greenland MD, MD Referring MD:          Alan Mulder, MD (Referring MD) Medicines:             Monitored Anesthesia Care Complications:         No immediate complications. Procedure:             Pre-Anesthesia Assessment:                        - Prior to the procedure, a History and Physical was                         performed, and patient medications, allergies and                         sensitivities were reviewed. The patient's tolerance                         of previous anesthesia was reviewed.                        - The risks and benefits of the procedure and the                         sedation options and risks were discussed with the                         patient. All questions were answered and informed                         consent was obtained.                        - Patient identification and proposed procedure were                         verified prior to the procedure by the physician, the                         nurse, the anesthetist and the technician. The                         procedure was verified in the pre-procedure area in                         the procedure room in the endoscopy suite.                        - ASA Grade Assessment: II - A patient with mild  systemic disease.                        - After reviewing the risks and benefits, the patient                         was deemed in satisfactory condition to undergo the                         procedure.                        After obtaining informed  consent, the colonoscope was                         passed under direct vision. Throughout the procedure,                         the patient's blood pressure, pulse, and oxygen                         saturations were monitored continuously. The was                         introduced through the anus and advanced to the the                         cecum, identified by appendiceal orifice and ileocecal                         valve. The colonoscopy was performed with ease. The                         patient tolerated the procedure well. The quality of                         the bowel preparation was good. Findings:      The perianal and digital rectal examinations were normal.      A patchy area of mildly erythematous mucosa was found in the sigmoid       colon. Biopsies were taken with a cold forceps for histology.      The exam was otherwise normal throughout the examined colon.      The rectum, sigmoid colon, descending colon, transverse colon, ascending       colon and cecum appeared normal.      The retroflexed view of the distal rectum and anal verge was normal and       showed no anal or rectal abnormalities. Impression:            - Erythematous mucosa in the sigmoid colon. Biopsied.                        - The rectum, sigmoid colon, descending colon,                         transverse colon, ascending colon and cecum are normal.                        - The distal rectum and anal verge are  normal on                         retroflexion view. Recommendation:        - Discharge patient to home.                        - Resume previous diet.                        - Continue present medications.                        - Repeat colonoscopy date to be determined after                         pending pathology results are reviewed.                        - Return to primary care physician as previously                         scheduled.                        - The findings and  recommendations were discussed with                         the patient.                        - The findings and recommendations were discussed with                         the patient's family. Procedure Code(s):     --- Professional ---                        [phone], Colonoscopy, flexible; with biopsy, single or                         multiple Diagnosis Code(s):     --- Professional ---                        Z12.11, Encounter for screening for malignant neoplasm                         of colon                        K63.89, Other specified diseases of intestine CPT copyright 2019 American Medical Association. All rights reserved. The codes documented in this report are preliminary and upon coder review may  be revised to meet current compliance requirements.  [name], MD Michel Bickers B. Maximino Greenland MD, MD 05/03/2021 9:00:43 AM This report has been signed electronically. Number of Addenda: 0 Note Initiated On: 05/03/2021 8:07 AM Scope Withdrawal Time: 0 hours 17 minutes 34 seconds  Total Procedure Duration: 0 hours 24 minutes 24 seconds  Estimated Blood Loss:  Estimated blood loss: none.      [facility]

## 2021-05-03 NOTE — Anesthesia Procedure Notes (Signed)
Procedure Name: MAC Date/Time: 05/03/2021 8:13 AM Performed by: Vanetta Shawl, CRNA Pre-anesthesia Checklist: Patient identified, Emergency Drugs available, Suction available, Timeout performed and Patient being monitored Patient Re-evaluated:Patient Re-evaluated prior to induction Oxygen Delivery Method: Nasal cannula Placement Confirmation: positive ETCO2

## 2021-05-03 NOTE — Anesthesia Postprocedure Evaluation (Signed)
Anesthesia Post Note  Patient: [name]  Procedure(s) Performed: ESOPHAGOGASTRODUODENOSCOPY (EGD) WITH PROPOFOL (N/A ) COLONOSCOPY WITH PROPOFOL (N/A ) ESOPHAGEAL DILATION     Patient location during evaluation: PACU Anesthesia Type: General Level of consciousness: awake and alert, oriented and patient cooperative Pain management: pain level controlled Vital Signs Assessment: post-procedure vital signs reviewed and stable Respiratory status: spontaneous breathing, nonlabored ventilation and respiratory function stable Cardiovascular status: blood pressure returned to baseline and stable Postop Assessment: adequate PO intake Anesthetic complications: no   No complications documented.  Amber Mayer

## 2021-05-03 NOTE — Transfer of Care (Signed)
Immediate Anesthesia Transfer of Care Note  Patient: Amber Mayer  Procedure(s) Performed: ESOPHAGOGASTRODUODENOSCOPY (EGD) WITH PROPOFOL (N/A ) COLONOSCOPY WITH PROPOFOL (N/A ) ESOPHAGEAL DILATION  Patient Location: PACU  Anesthesia Type: General  Level of Consciousness: awake, alert  and patient cooperative  Airway and Oxygen Therapy: Patient Spontanous Breathing   Post-op Assessment: Post-op Vital signs reviewed, Patient's Cardiovascular Status Stable, Respiratory Function Stable, Patent Airway and No signs of Nausea or vomiting  Post-op Vital Signs: Reviewed and stable  Complications: No complications documented.

## 2021-05-03 NOTE — H&P (Signed)
[name], MD [address], [place], [place], [address] [address], [address], [place], [place], [address] Phone: [phone]  Fax: [phone]  Primary Care Physician:  Alan Mulder, MD   Pre-Procedure History & Physical: HPI:  Amber Mayer is a 55 y.o. female is here for a colonoscopy and EGD.   Past Medical History:  Diagnosis Date  . Allergic rhinitis   . Anemia   . Anxiety disorder   . Borderline diabetes   . Dependent edema   . Diabetes mellitus without complication (HCC)   . Exogenous obesity   . Familial hypercholesteremia   . Hashimoto's thyroiditis   . Hyperlipidemia   . Hypertension   . Normochromic anemia   . Thyroid disease   . Vitamin B12 deficiency   . Vitamin D deficiency     Past Surgical History:  Procedure Laterality Date  . cesarian section     4x  . COMBINED HYSTEROSCOPY DIAGNOSTIC / D&C    . HYSTEROSCOPY    . TUBAL LIGATION      Prior to Admission medications   Medication Sig Start Date End Date Taking? Authorizing Provider  levothyroxine (SYNTHROID) 75 MCG tablet Take 75 mcg by mouth daily before breakfast.   Yes [provider]  thyroid (ARMOUR) 90 MG tablet Take 90 mg by mouth daily.   Yes [provider]  Vitamin D, Ergocalciferol, (DRISDOL) 50000 units CAPS capsule Take 50,000 Units by mouth. Twice weekly.   Yes [provider]  Na Sulfate-K Sulfate-Mg Sulf 17.5-3.13-1.6 GM/177ML SOLN At 5 PM the day before procedure take 1 bottle and 5 hours before procedure take 1 bottle. 04/05/21   Pasty Spillers, MD  Na Sulfate-K Sulfate-Mg Sulf 17.5-3.13-1.6 GM/177ML SOLN At 5 PM two days before procedure take 1 bottle. 04/05/21   Pasty Spillers, MD    Allergies as of 04/05/2021 - Review Complete 04/05/2021  Allergen Reaction Noted  . Erythromycin Nausea And Vomiting 04/19/2015  . Macrolides and ketolides  11/09/2020    Family History  Problem Relation Age of Onset  .  Diabetes Mother   . Hypertension Mother   . Lung cancer Mother 45  . Prostate cancer Father   . Breast cancer Neg Hx     Social History   Socioeconomic History  . Marital status: Married    Spouse name: Not on file  . Number of children: Not on file  . Years of education: Not on file  . Highest education level: Not on file  Occupational History  . Not on file  Tobacco Use  . Smoking status: Never Smoker  . Smokeless tobacco: Never Used  Vaping Use  . Vaping Use: Never used  Substance and Sexual Activity  . Alcohol use: No  . Drug use: Never  . Sexual activity: Yes    Birth control/protection: None, Surgical  Other Topics Concern  . Not on file  Social History Narrative  . Not on file   Social Determinants of Health   Financial Resource Strain: Not on file  Food Insecurity: Not on file  Transportation Needs: Not on file  Physical Activity: Not on file  Stress: Not on file  Social Connections: Not on file  Intimate Partner Violence: Not on file    Review of Systems: See HPI, otherwise negative ROS  Physical Exam: BP 136/79   Pulse (!) 104   Temp 97.8 F (36.6 C) (Temporal)   Ht 5\' 5"  (1.651 m)   Wt 117  kg   SpO2 95%   BMI 42.93 kg/m  General:   Alert,  pleasant and cooperative in NAD Head:  Normocephalic and atraumatic. Neck:  Supple; no masses or thyromegaly. Lungs:  Clear throughout to auscultation, normal respiratory effort.    Heart:  +S1, +S2, Regular rate and rhythm, No edema. Abdomen:  Soft, nontender and nondistended. Normal bowel sounds, without guarding, and without rebound.   Neurologic:  Alert and  oriented x4;  grossly normal neurologically.  Impression/Plan: Amber Mayer is here for a colonoscopy to be performed for average risk screening and EGD for dysphagia.  Risks, benefits, limitations, and alternatives regarding the procedures have been reviewed with the patient.  Questions have been answered.  All parties  agreeable.   Pasty Spillers, MD  05/03/2021, 7:56 AM

## 2021-05-04 ENCOUNTER — Encounter: Payer: Self-pay | Admitting: Gastroenterology

## 2021-05-05 ENCOUNTER — Other Ambulatory Visit: Payer: Self-pay

## 2021-05-05 LAB — SURGICAL PATHOLOGY

## 2021-05-05 MED ORDER — OMEPRAZOLE 20 MG PO CPDR: 20.0000 mg | DELAYED_RELEASE_CAPSULE | Freq: Every day | ORAL | 0 refills | Status: DC

## 2021-05-05 NOTE — Telephone Encounter (Signed)
Per the pharmacy, insurance requires 90 day supply... Original Rx was for #60 will resend for #90

## 2021-05-06 ENCOUNTER — Encounter: Payer: Self-pay | Admitting: Gastroenterology

## 2021-06-25 ENCOUNTER — Other Ambulatory Visit: Payer: Self-pay

## 2021-06-25 ENCOUNTER — Ambulatory Visit
Admission: RE | Admit: 2021-06-25 | Discharge: 2021-06-25 | Disposition: A | Discharge status: Home / Self Care | Payer: 59 | Source: Ambulatory Visit | Attending: Family Medicine | Admitting: Family Medicine

## 2021-06-25 VITALS — BP 147/91 | HR 110 | Temp 98.0°F | Resp 14 | Ht 65.0 in | Wt 255.0 lb

## 2021-06-25 DIAGNOSIS — J069 Acute upper respiratory infection, unspecified: Secondary | ICD-10-CM

## 2021-06-25 DIAGNOSIS — Z20822 Contact with and (suspected) exposure to covid-19: Secondary | ICD-10-CM | POA: Insufficient documentation

## 2021-06-25 MED ORDER — CETIRIZINE-PSEUDOEPHEDRINE ER 5-120 MG PO TB12: 1.0000 | ORAL_TABLET | Freq: Two times a day (BID) | ORAL | 0 refills | Status: DC

## 2021-06-25 MED ORDER — PROMETHAZINE-DM 6.25-15 MG/5ML PO SYRP: 5.0000 mL | ORAL_SOLUTION | Freq: Four times a day (QID) | ORAL | 0 refills | Status: DC | PRN

## 2021-06-25 NOTE — Discharge Instructions (Addendum)
Awaiting COVID test results. Should be back tomorrow.  Rest. Fluids.  Medication as directed.  Take care  Dr. Adriana Simas

## 2021-06-25 NOTE — ED Provider Notes (Signed)
[facility]    CSN: [id number] Arrival date & time: 06/25/21  1149      History   Chief Complaint Chief Complaint  Patient presents with   Cough   Laryngitis   Otalgia   HPI  55 year old female presents with the above complaints.  Patient reports that her symptoms started on Tuesday.  She states that she feels very poorly.  She reports cough, headache, right ear pain, runny nose, voice change/hoarseness, fatigue.  No documented fever.  No relieving factors.  No reported sick contacts.  Patient also states that she is experiencing elevated heart rate.  He states that her heart rate rises when she does physical activity.  Denies shortness of breath.  No other complaints or concerns at this time.  Past Medical History:  Diagnosis Date   Allergic rhinitis    Anemia    Anxiety disorder    Borderline diabetes    Dependent edema    Diabetes mellitus without complication (HCC)    Exogenous obesity    Familial hypercholesteremia    Hashimoto's thyroiditis    Hyperlipidemia    Hypertension    Normochromic anemia    Thyroid disease    Vitamin B12 deficiency    Vitamin D deficiency     Patient Active Problem List   Diagnosis Date Noted   Dysphagia    Gastric erythema    Schatzki's ring    Colon cancer screening    Erythema of colon     Past Surgical History:  Procedure Laterality Date   cesarian section     4x   COLONOSCOPY WITH PROPOFOL N/A 05/03/2021   Procedure: COLONOSCOPY WITH PROPOFOL;  Surgeon: Pasty Spillers, MD;  Location: [facility];  Service: Endoscopy;  Laterality: N/A;   COMBINED HYSTEROSCOPY DIAGNOSTIC / D&C     ESOPHAGEAL DILATION  05/03/2021   Procedure: ESOPHAGEAL DILATION;  Surgeon: Pasty Spillers, MD;  Location: [facility];  Service: Endoscopy;;   ESOPHAGOGASTRODUODENOSCOPY (EGD) WITH PROPOFOL N/A 05/03/2021   Procedure: ESOPHAGOGASTRODUODENOSCOPY (EGD) WITH PROPOFOL;  Surgeon: Pasty Spillers, MD;   Location: [facility];  Service: Endoscopy;  Laterality: N/A;   HYSTEROSCOPY     TUBAL LIGATION      OB History     Gravida  4   Para  4   Term  4   Preterm      AB      Living  4      SAB      IAB      Ectopic      Multiple      Live Births               Home Medications    Prior to Admission medications   Medication Sig Start Date End Date Taking? Authorizing Provider  cetirizine-pseudoephedrine (ZYRTEC-D) 5-120 MG tablet Take 1 tablet by mouth 2 (two) times daily. 06/25/21  Yes Amber Janvier G, DO  omeprazole (PRILOSEC) 20 MG capsule Take 1 capsule (20 mg total) by mouth daily. 05/05/21 07/04/21 Yes Pasty Spillers, MD  promethazine-dextromethorphan (PROMETHAZINE-DM) 6.25-15 MG/5ML syrup Take 5 mLs by mouth 4 (four) times daily as needed for cough. 06/25/21  Yes Amber Sams, DO  thyroid (ARMOUR) 90 MG tablet Take 90 mg by mouth daily.   Yes [provider]  Vitamin D, Ergocalciferol, (DRISDOL) 50000 units CAPS capsule Take 50,000 Units by mouth. Twice weekly.   Yes [provider]  Na Sulfate-K Sulfate-Mg Sulf  17.5-3.13-1.6 GM/177ML SOLN At 5 PM the day before procedure take 1 bottle and 5 hours before procedure take 1 bottle. 04/05/21   Pasty Spillers, MD  Na Sulfate-K Sulfate-Mg Sulf 17.5-3.13-1.6 GM/177ML SOLN At 5 PM two days before procedure take 1 bottle. 04/05/21   Pasty Spillers, MD    Family History Family History  Problem Relation Age of Onset   Diabetes Mother    Hypertension Mother    Lung cancer Mother 53   Prostate cancer Father    Breast cancer Neg Hx     Social History Social History   Tobacco Use   Smoking status: Never   Smokeless tobacco: Never  Vaping Use   Vaping Use: Never used  Substance Use Topics   Alcohol use: No   Drug use: Never     Allergies   Erythromycin and Macrolides and ketolides   Review of Systems Review of Systems Per HPI  Physical Exam Triage Vital Signs ED  Triage Vitals  Enc Vitals Group     BP 06/25/21 1201 (!) 147/91     Pulse Rate 06/25/21 1201 (!) 110     Resp 06/25/21 1201 14     Temp 06/25/21 1201 98 F (36.7 C)     Temp Source 06/25/21 1201 Oral     SpO2 06/25/21 1201 97 %     Weight 06/25/21 1159 255 lb (115.7 kg)     Height 06/25/21 1159 5\' 5"  (1.651 m)     Head Circumference --      Peak Flow --      Pain Score 06/25/21 1159 0     Pain Loc --      Pain Edu? --      Excl. in GC? --    Updated Vital Signs BP (!) 147/91 (BP Location: Left Arm)   Pulse (!) 110   Temp 98 F (36.7 C) (Oral)   Resp 14   Ht 5\' 5"  (1.651 m)   Wt 115.7 kg   LMP 09/20/2020   SpO2 97%   BMI 42.43 kg/m   Visual Acuity Right Eye Distance:   Left Eye Distance:   Bilateral Distance:    Right Eye Near:   Left Eye Near:    Bilateral Near:     Physical Exam Constitutional:      General: She is not in acute distress.    Appearance: She is obese.  HENT:     Head: Normocephalic and atraumatic.     Right Ear: Tympanic membrane normal.     Left Ear: Tympanic membrane normal.     Mouth/Throat:     Pharynx: Oropharynx is clear.  Eyes:     General:        Right eye: No discharge.        Left eye: No discharge.     Conjunctiva/sclera: Conjunctivae normal.  Cardiovascular:     Rate and Rhythm: Regular rhythm. Tachycardia present.  Pulmonary:     Effort: Pulmonary effort is normal. No respiratory distress.     Breath sounds: Normal breath sounds. No wheezing, rhonchi or rales.  Neurological:     Mental Status: She is alert.  Psychiatric:        Behavior: Behavior normal.     UC Treatments / Results  Labs (all labs ordered are listed, but only abnormal results are displayed) Labs Reviewed  SARS CORONAVIRUS 2 (TAT 6-24 HRS)    EKG   Radiology No results found.  Procedures Procedures (including  critical care time)  Medications Ordered in UC Medications - No data to display  Initial Impression / Assessment and Plan / UC  Course  I have reviewed the triage vital signs and the nursing notes.  Pertinent labs & imaging results that were available during my care of the patient were reviewed by me and considered in my medical decision making (see chart for details).    55 year old female presents with a viral URI with cough.  Possible COVID-19.  Awaiting test results.  Promethazine DM for cough.  Zyrtec-D for URI symptoms.  Supportive care.  Patient is a candidate for antiviral treatment if COVID testing is positive.  Final Clinical Impressions(s) / UC Diagnoses   Final diagnoses:  Viral URI with cough     Discharge Instructions      Awaiting COVID test results. Should be back tomorrow.  Rest. Fluids.  Medication as directed.  Take care  Dr. Adriana Simas    ED Prescriptions     Medication Sig Dispense Auth. Provider   promethazine-dextromethorphan (PROMETHAZINE-DM) 6.25-15 MG/5ML syrup Take 5 mLs by mouth 4 (four) times daily as needed for cough. 118 mL Amber Mayer G, DO   cetirizine-pseudoephedrine (ZYRTEC-D) 5-120 MG tablet Take 1 tablet by mouth 2 (two) times daily. 30 tablet Amber Sams, DO      PDMP not reviewed this encounter.   Amber Mayer, [place] 06/25/21 1437

## 2021-06-25 NOTE — ED Triage Notes (Signed)
Patient c/o cough, HAs, right ear pain, runny nose that started on Tuesday.  Patient denies fevers.

## 2021-06-26 LAB — SARS CORONAVIRUS 2 (TAT 6-24 HRS): SARS Coronavirus 2: NEGATIVE

## 2021-06-28 ENCOUNTER — Ambulatory Visit: Payer: Self-pay

## 2021-07-05 ENCOUNTER — Ambulatory Visit: Payer: 59 | Admitting: Gastroenterology

## 2021-08-31 ENCOUNTER — Telehealth: Payer: Self-pay

## 2021-08-31 NOTE — Telephone Encounter (Signed)
-----   Message from Nadara Mustard, MD sent at 08/30/2021  7:53 AM EDT ----- Regarding: MMG Received notice she has not received MMG yet as ordered at her Annual. Please check and encourage her to do this, and document conversation.

## 2021-08-31 NOTE — Telephone Encounter (Signed)
Pt states she has had so much going on with her mom but she will call and schedule her mammo today

## 2021-10-04 ENCOUNTER — Other Ambulatory Visit: Payer: Self-pay | Admitting: Obstetrics & Gynecology

## 2021-11-11 ENCOUNTER — Encounter: Payer: Self-pay | Admitting: Obstetrics & Gynecology

## 2022-02-07 ENCOUNTER — Other Ambulatory Visit: Payer: Self-pay | Admitting: Obstetrics & Gynecology

## 2022-02-07 ENCOUNTER — Encounter: Payer: Self-pay | Admitting: Obstetrics & Gynecology

## 2022-02-14 ENCOUNTER — Ambulatory Visit: Payer: 59

## 2022-03-25 ENCOUNTER — Other Ambulatory Visit: Payer: Self-pay | Admitting: Obstetrics & Gynecology

## 2022-03-25 DIAGNOSIS — Z1231 Encounter for screening mammogram for malignant neoplasm of breast: Secondary | ICD-10-CM

## 2023-01-09 ENCOUNTER — Ambulatory Visit: Payer: Self-pay | Admitting: Obstetrics & Gynecology

## 2023-02-06 ENCOUNTER — Ambulatory Visit (INDEPENDENT_AMBULATORY_CARE_PROVIDER_SITE_OTHER): Payer: Managed Care, Other (non HMO) | Admitting: Obstetrics & Gynecology

## 2023-02-06 ENCOUNTER — Encounter: Payer: Self-pay | Admitting: Obstetrics & Gynecology

## 2023-02-06 VITALS — BP 147/87 | HR 91 | Ht 65.0 in | Wt 262.0 lb

## 2023-02-06 DIAGNOSIS — Z01419 Encounter for gynecological examination (general) (routine) without abnormal findings: Secondary | ICD-10-CM | POA: Diagnosis not present

## 2023-02-06 DIAGNOSIS — Z1231 Encounter for screening mammogram for malignant neoplasm of breast: Secondary | ICD-10-CM

## 2023-02-06 MED ORDER — CLOTRIMAZOLE-BETAMETHASONE 1-0.05 % EX CREA: TOPICAL_CREAM | CUTANEOUS | 6 refills | Status: DC

## 2023-02-06 NOTE — Progress Notes (Signed)
Subjective:    Amber Mayer is a 57 y.o. married P4 who presents for an annual exam. The patient has no complaints today. The patient is sexually active. GYN screening history: last pap: was normal in 2022. The patient wears seatbelts: yes. The patient participates in regular exercise: no. Has the patient ever been transfused or tattooed?: no. The patient reports that there is not domestic violence in her life.   Menstrual History: OB History     Gravida  4   Para  4   Term  4   Preterm      AB      Living  4      SAB      IAB      Ectopic      Multiple      Live Births              Menopause- in 2021  Patient's last menstrual period was 09/20/2020.    The following portions of the patient's history were reviewed and updated as appropriate: allergies, current medications, past family history, past medical history, past social history, past surgical history, and problem list.  Review of Systems Pertinent items are noted in HPI.  She has been married since 1997 Her colonoscopy is UTD. FH- no  breast, gyn, colon cancer She gets her fasting labs with her primary care Dr. Andy Gauss    Objective:    BP (!) 147/87   Pulse 91   Ht '5\' 5"'$  (1.651 m)   Wt 262 lb (118.8 kg)   LMP 09/20/2020   BMI 43.60 kg/m   General Appearance:    Alert, cooperative, no distress, appears stated age  Head:    Normocephalic, without obvious abnormality, atraumatic  Eyes:    PERRL, conjunctiva/corneas clear, EOM's intact, fundi    benign, both eyes  Ears:    Normal TM's and external ear canals, both ears  Nose:   Nares normal, septum midline, mucosa normal, no drainage    or sinus tenderness  Throat:   Lips, mucosa, and tongue normal; teeth and gums normal  Neck:   Supple, symmetrical, trachea midline, no adenopathy;    thyroid:  no enlargement/tenderness/nodules; no carotid   bruit or JVD  Back:     Symmetric, no curvature, ROM normal, no CVA tenderness  Lungs:     Clear  to auscultation bilaterally, respirations unlabored  Chest Wall:    No tenderness or deformity   Heart:    Regular rate and rhythm, S1 and S2 normal, no murmur, rub   or gallop  Breast Exam:    No tenderness, masses, or nipple abnormality  Abdomen:     Soft, non-tender, bowel sounds active all four quadrants,    no masses, no organomegaly  Genitalia:    Normal female without lesion, discharge or tenderness     Extremities:   Extremities normal, atraumatic, no cyanosis or edema  Pulses:   2+ and symmetric all extremities  Skin:   Skin color, texture, turgor normal, no rashes or lesions  Lymph nodes:   Cervical, supraclavicular, and axillary nodes normal  Neurologic:   CNII-XII intact, normal strength, sensation and reflexes    throughout  .    Assessment:    Healthy female exam.    Plan:     Thin prep Pap smear. Due in 2027 Mammogram ordered

## 2023-06-11 ENCOUNTER — Other Ambulatory Visit: Payer: Self-pay | Admitting: Obstetrics & Gynecology

## 2023-06-11 DIAGNOSIS — Z1231 Encounter for screening mammogram for malignant neoplasm of breast: Secondary | ICD-10-CM

## 2023-06-27 ENCOUNTER — Ambulatory Visit: Payer: Managed Care, Other (non HMO)

## 2023-07-25 ENCOUNTER — Ambulatory Visit
Admission: RE | Admit: 2023-07-25 | Discharge: 2023-07-25 | Disposition: A | Discharge status: Home / Self Care | Payer: Managed Care, Other (non HMO) | Source: Ambulatory Visit | Attending: Obstetrics & Gynecology | Admitting: Obstetrics & Gynecology

## 2023-07-25 DIAGNOSIS — Z1231 Encounter for screening mammogram for malignant neoplasm of breast: Secondary | ICD-10-CM | POA: Insufficient documentation

## 2023-07-30 ENCOUNTER — Encounter: Payer: Self-pay | Admitting: Obstetrics & Gynecology

## 2023-08-22 ENCOUNTER — Ambulatory Visit
Admission: RE | Admit: 2023-08-22 | Discharge: 2023-08-22 | Disposition: A | Discharge status: Home / Self Care | Payer: Managed Care, Other (non HMO) | Source: Ambulatory Visit | Attending: Family Medicine | Admitting: Family Medicine

## 2023-08-22 ENCOUNTER — Ambulatory Visit (INDEPENDENT_AMBULATORY_CARE_PROVIDER_SITE_OTHER): Payer: Managed Care, Other (non HMO)

## 2023-08-22 VITALS — BP 136/85 | HR 80 | Temp 97.7°F | Resp 17 | Wt 250.0 lb

## 2023-08-22 DIAGNOSIS — S29019A Strain of muscle and tendon of unspecified wall of thorax, initial encounter: Secondary | ICD-10-CM

## 2023-08-22 DIAGNOSIS — M549 Dorsalgia, unspecified: Secondary | ICD-10-CM

## 2023-08-22 MED ORDER — METHOCARBAMOL 500 MG PO TABS: 500.0000 mg | ORAL_TABLET | Freq: Two times a day (BID) | ORAL | 0 refills | Status: DC

## 2023-08-22 MED ORDER — GABAPENTIN 100 MG PO CAPS: 100.0000 mg | ORAL_CAPSULE | Freq: Three times a day (TID) | ORAL | 0 refills | Status: DC

## 2023-08-22 MED ORDER — NAPROXEN 500 MG PO TABS: 500.0000 mg | ORAL_TABLET | Freq: Two times a day (BID) | ORAL | 0 refills | Status: DC

## 2023-08-22 NOTE — ED Triage Notes (Signed)
Sx started 2 weeks ago.   Back pain-neck pain-shoulder pain. Under her arm on the left side is hurting. She's tries alternating ice-heat. She feels like she pulled a muscle.

## 2023-08-22 NOTE — ED Provider Notes (Signed)
[facility]    CSN: [id number] Arrival date & time: 08/22/23  0930      History   Chief Complaint Chief Complaint  Patient presents with   Back Pain    Back pain, underarm soreness for 2 weeks... unknown cause. - Entered by patient    HPI  HPI Amber Mayer is a 57 y.o. female.   Algene presents for midline upper back pain that raiates to her left shoulder that started 2 weeks ago.  Has pain under her arm. Has "stabbing ripping pain."  Pain was intermtitent but now its constant.  She slept "terrible." She laid on her stomach last night.  She with the left arm under her pillow.  No recent falls, injury or trauma. Two-three months ago she had similar pain. Getting up from the desk made the pain better.  She having toruble sleeping at night.  Sleeping on her right side makes pain worse. Took Motrin and Tylenol for pain without relief.       Past Medical History:  Diagnosis Date   Allergic rhinitis    Anemia    Anxiety disorder    Borderline diabetes    Dependent edema    Diabetes mellitus without complication (HCC)    Exogenous obesity    Familial hypercholesteremia    Hashimoto's thyroiditis    Hyperlipidemia    Hypertension    Normochromic anemia    Thyroid disease    Vitamin B12 deficiency    Vitamin D deficiency     Patient Active Problem List   Diagnosis Date Noted   Dysphagia    Gastric erythema    Schatzki's ring    Colon cancer screening    Erythema of colon     Past Surgical History:  Procedure Laterality Date   cesarian section     4x   COLONOSCOPY WITH PROPOFOL N/A 05/03/2021   Procedure: COLONOSCOPY WITH PROPOFOL;  Surgeon: Pasty Spillers, MD;  Location: [facility];  Service: Endoscopy;  Laterality: N/A;   COMBINED HYSTEROSCOPY DIAGNOSTIC / D&C     ESOPHAGEAL DILATION  05/03/2021   Procedure: ESOPHAGEAL DILATION;  Surgeon: Pasty Spillers, MD;  Location: [facility];  Service: Endoscopy;;    ESOPHAGOGASTRODUODENOSCOPY (EGD) WITH PROPOFOL N/A 05/03/2021   Procedure: ESOPHAGOGASTRODUODENOSCOPY (EGD) WITH PROPOFOL;  Surgeon: Pasty Spillers, MD;  Location: [facility];  Service: Endoscopy;  Laterality: N/A;   HYSTEROSCOPY     TUBAL LIGATION      OB History     Gravida  4   Para  4   Term  4   Preterm      AB      Living  4      SAB      IAB      Ectopic      Multiple      Live Births               Home Medications    Prior to Admission medications   Medication Sig Start Date End Date Taking? Authorizing Provider  gabapentin (NEURONTIN) 100 MG capsule Take 1 capsule (100 mg total) by mouth 3 (three) times daily. 08/22/23  Yes Whittley Carandang, DO  methocarbamol (ROBAXIN) 500 MG tablet Take 1 tablet (500 mg total) by mouth 2 (two) times daily. 08/22/23  Yes Kitzia Camus, DO  naproxen (NAPROSYN) 500 MG tablet Take 1 tablet (500 mg total) by mouth 2 (two) times daily with a meal. 08/22/23  Yes  Katha Cabal, DO  thyroid (ARMOUR) 90 MG tablet Take 90 mg by mouth daily.   Yes [provider]  Vitamin D, Ergocalciferol, (DRISDOL) 50000 units CAPS capsule Take 50,000 Units by mouth. Twice weekly.   Yes [provider]  cetirizine-pseudoephedrine (ZYRTEC-D) 5-120 MG tablet Take 1 tablet by mouth 2 (two) times daily. 06/25/21   Tommie Sams, DO  clotrimazole-betamethasone (LOTRISONE) cream APPLY TOPICALLY TO THE AFFECTED AREA TWICE DAILY 02/06/23   Dove, Myra C, MD  Na Sulfate-K Sulfate-Mg Sulf 17.5-3.13-1.6 GM/177ML SOLN At 5 PM the day before procedure take 1 bottle and 5 hours before procedure take 1 bottle. Patient not taking: Reported on 02/06/2023 04/05/21   Pasty Spillers, MD  Na Sulfate-K Sulfate-Mg Sulf 17.5-3.13-1.6 GM/177ML SOLN At 5 PM two days before procedure take 1 bottle. Patient not taking: Reported on 02/06/2023 04/05/21   Pasty Spillers, MD  omeprazole (PRILOSEC) 20 MG capsule Take 1 capsule (20 mg total) by  mouth daily. 05/05/21 07/04/21  Pasty Spillers, MD  promethazine-dextromethorphan (PROMETHAZINE-DM) 6.25-15 MG/5ML syrup Take 5 mLs by mouth 4 (four) times daily as needed for cough. Patient not taking: Reported on 02/06/2023 06/25/21   Tommie Sams, DO    Family History Family History  Problem Relation Age of Onset   Diabetes Mother    Hypertension Mother    Lung cancer Mother 3   Prostate cancer Father    Breast cancer Neg Hx     Social History Social History   Tobacco Use   Smoking status: Never   Smokeless tobacco: Never  Vaping Use   Vaping status: Never Used  Substance Use Topics   Alcohol use: No   Drug use: Never     Allergies   Erythromycin, Macrolides and ketolides, and Sulfa antibiotics   Review of Systems Review of Systems: :negative unless otherwise stated in HPI.      Physical Exam Triage Vital Signs ED Triage Vitals  Encounter Vitals Group     BP 08/22/23 1003 136/85     Systolic BP Percentile --      Diastolic BP Percentile --      Pulse Rate 08/22/23 1003 80     Resp 08/22/23 1003 17     Temp 08/22/23 1003 97.7 F (36.5 C)     Temp Source 08/22/23 1003 Oral     SpO2 08/22/23 1003 94 %     Weight 08/22/23 1002 250 lb (113.4 kg)     Height --      Head Circumference --      Peak Flow --      Pain Score 08/22/23 1002 7     Pain Loc --      Pain Education --      Exclude from Growth Chart --    No data found.  Updated Vital Signs BP 136/85 (BP Location: Right Arm)   Pulse 80   Temp 97.7 F (36.5 C) (Oral)   Resp 17   Wt 113.4 kg   LMP 09/20/2020   SpO2 94%   BMI 41.60 kg/m   Visual Acuity Right Eye Distance:   Left Eye Distance:   Bilateral Distance:    Right Eye Near:   Left Eye Near:    Bilateral Near:     Physical Exam GEN: well appearing female in no acute distress  CVS: well perfused  RESP: speaking in full sentences without pause, no respiratory distress  MSK:  Spine: C-Spine: No midline spinous process  tenderness, no hypertonicity, normal range of motion T-spine: Trigger points identified,+midline spinous process tenderness, + hypertonicity overlying the rhomboids L-spine: No midline spinous process tenderness, no hypertonicity, good range of motion No evidence of bony deformity, asymmetry, or muscle atrophy. No abnormal scapular function observed.  Strength 5/5 upper and lower extremities.  Sensation intact. Peripheral pulses intact.   UC Treatments / Results  Labs (all labs ordered are listed, but only abnormal results are displayed) Labs Reviewed - No data to display  EKG   Radiology DG Thoracic Spine 2 View  Result Date: 08/22/2023 CLINICAL DATA:  Chronic back pain, worse x2 weeks EXAM: THORACIC SPINE 2 VIEWS COMPARISON:  None Available. FINDINGS: Normal thoracic kyphosis. No evidence of fracture or dislocation. Vertebral body heights are maintained. Visualized lungs are clear. IMPRESSION: Negative. Electronically Signed   By: Charline Bills M.D.   On: 08/22/2023 11:51     Procedures Procedures (including critical care time)  Medications Ordered in UC Medications - No data to display  Initial Impression / Assessment and Plan / UC Course  I have reviewed the triage vital signs and the nursing notes.  Pertinent labs & imaging results that were available during my care of the patient were reviewed by me and considered in my medical decision making (see chart for details).      Pt is a 57 y.o.  female with history of neck and back pain presents for acute flareup that started 2 weeks ago.  On exam, he has midline thoracic spine tenderness with hypertonicity and trigger points identified..   Obtained thoracic spine  plain films.  Personally interpreted by me were unremarkable for fracture or or significant malalignment. Radiologist report reviewed and additionally notes normal thoracic kyphosis.   Patient to gradually return to normal activities, as tolerated and continue  ordinary activities within the limits permitted by pain. Prescribed gabapentin, naproxen sodium  and muscle relaxer  for pain relief.  Tylenol PRN. Advised patient to avoid OTC NSAIDs while taking prescription NSAID. Counseled patient on red flag symptoms and when to seek immediate care.   Patient to follow up with orthopedic spine specialist, if symptoms do not improve with conservative treatment.  Return and ED precautions given. Understanding voiced. Discussed MDM, treatment plan and plan for follow-up with patient who agrees with plan.   Final Clinical Impressions(s) / UC Diagnoses   Final diagnoses:  Thoracic myofascial strain, initial encounter     Discharge Instructions      If medication was prescribed, stop by the pharmacy to pick up your prescriptions.  For your back pain, Take 1500 mg Tylenol twice a day, take muscle relaxer (robaxin/methocarbamol) twice a day, take Naprosyn twice a day,  as needed for pain. Gabapentin can help with sleep and pain. Consider stopping by the pharmacy or dollar store to pick up some Lidocaine patches. Apply for 12 hours and then remove.   Watch for worsening symptoms such as an increasing weakness or loss of sensation in your arms or legs, increasing pain and/or the loss of bladder or bowel function. Should any of these occur, go to the emergency department immediately.       ED Prescriptions     Medication Sig Dispense Auth. Provider   gabapentin (NEURONTIN) 100 MG capsule Take 1 capsule (100 mg total) by mouth 3 (three) times daily. 30 capsule Panzy Bubeck, DO   methocarbamol (ROBAXIN) 500 MG tablet Take 1 tablet (500 mg total) by mouth 2 (two) times daily. 20 tablet Katha Cabal,  DO   naproxen (NAPROSYN) 500 MG tablet Take 1 tablet (500 mg total) by mouth 2 (two) times daily with a meal. 30 tablet Raoul Ciano, DO      PDMP not reviewed this encounter.   Katha Cabal, DO 08/25/23 1006

## 2023-08-22 NOTE — Discharge Instructions (Addendum)
If medication was prescribed, stop by the pharmacy to pick up your prescriptions.  For your back pain, Take 1500 mg Tylenol twice a day, take muscle relaxer (robaxin/methocarbamol) twice a day, take Naprosyn twice a day,  as needed for pain. Gabapentin can help with sleep and pain. Consider stopping by the pharmacy or dollar store to pick up some Lidocaine patches. Apply for 12 hours and then remove.   Watch for worsening symptoms such as an increasing weakness or loss of sensation in your arms or legs, increasing pain and/or the loss of bladder or bowel function. Should any of these occur, go to the emergency department immediately.

## 2024-02-15 ENCOUNTER — Other Ambulatory Visit: Payer: Self-pay | Admitting: Obstetrics & Gynecology

## 2024-02-22 ENCOUNTER — Other Ambulatory Visit: Payer: Self-pay

## 2024-02-22 DIAGNOSIS — N898 Other specified noninflammatory disorders of vagina: Secondary | ICD-10-CM

## 2024-02-22 MED ORDER — CLOTRIMAZOLE-BETAMETHASONE 1-0.05 % EX CREA
TOPICAL_CREAM | CUTANEOUS | 6 refills | Status: DC
Start: 2024-02-22 — End: 2024-08-12

## 2024-03-31 ENCOUNTER — Ambulatory Visit: Admitting: Obstetrics & Gynecology

## 2024-04-02 ENCOUNTER — Emergency Department
Admission: EM | Admit: 2024-04-02 | Discharge: 2024-04-02 | Disposition: A | Discharge status: Home / Self Care | Attending: Emergency Medicine | Admitting: Emergency Medicine

## 2024-04-02 ENCOUNTER — Emergency Department

## 2024-04-02 DIAGNOSIS — I10 Essential (primary) hypertension: Secondary | ICD-10-CM | POA: Insufficient documentation

## 2024-04-02 DIAGNOSIS — Z79899 Other long term (current) drug therapy: Secondary | ICD-10-CM | POA: Insufficient documentation

## 2024-04-02 DIAGNOSIS — Z7984 Long term (current) use of oral hypoglycemic drugs: Secondary | ICD-10-CM | POA: Insufficient documentation

## 2024-04-02 DIAGNOSIS — R002 Palpitations: Secondary | ICD-10-CM | POA: Insufficient documentation

## 2024-04-02 DIAGNOSIS — E119 Type 2 diabetes mellitus without complications: Secondary | ICD-10-CM | POA: Insufficient documentation

## 2024-04-02 LAB — CBC WITH DIFFERENTIAL/PLATELET
Abs Immature Granulocytes: 0 10*3/uL (ref 0.00–0.07)
Basophils Absolute: 0 10*3/uL (ref 0.0–0.1)
Basophils Relative: 1 %
Eosinophils Absolute: 0.2 10*3/uL (ref 0.0–0.5)
Eosinophils Relative: 5 %
HCT: 40.4 % (ref 36.0–46.0)
Hemoglobin: 13.3 g/dL (ref 12.0–15.0)
Immature Granulocytes: 0 %
Lymphocytes Relative: 20 %
Lymphs Abs: 1 10*3/uL (ref 0.7–4.0)
MCH: 29.5 pg (ref 26.0–34.0)
MCHC: 32.9 g/dL (ref 30.0–36.0)
MCV: 89.6 fL (ref 80.0–100.0)
Monocytes Absolute: 0.4 10*3/uL (ref 0.1–1.0)
Monocytes Relative: 9 %
Neutro Abs: 3.1 10*3/uL (ref 1.7–7.7)
Neutrophils Relative %: 65 %
Platelets: 255 10*3/uL (ref 150–400)
RBC: 4.51 MIL/uL (ref 3.87–5.11)
RDW: 12.9 % (ref 11.5–15.5)
WBC: 4.8 10*3/uL (ref 4.0–10.5)
nRBC: 0 % (ref 0.0–0.2)

## 2024-04-02 LAB — URINALYSIS, ROUTINE W REFLEX MICROSCOPIC
Bilirubin Urine: NEGATIVE
Glucose, UA: NEGATIVE mg/dL
Ketones, ur: NEGATIVE mg/dL
Leukocytes,Ua: NEGATIVE
Nitrite: NEGATIVE
Protein, ur: NEGATIVE mg/dL
Specific Gravity, Urine: 1.006 (ref 1.005–1.030)
pH: 7 (ref 5.0–8.0)

## 2024-04-02 LAB — TROPONIN I (HIGH SENSITIVITY): Troponin I (High Sensitivity): 5 ng/L (ref <18)

## 2024-04-02 LAB — BASIC METABOLIC PANEL WITH GFR
Anion gap: 7 (ref 5–15)
BUN: 20 mg/dL (ref 6–20)
CO2: 27 mmol/L (ref 22–32)
Calcium: 9 mg/dL (ref 8.9–10.3)
Chloride: 104 mmol/L (ref 98–111)
Creatinine, Ser: 0.84 mg/dL (ref 0.44–1.00)
GFR, Estimated: >60 mL/min (ref >60)
Glucose, Bld: 139 mg/dL — ABNORMAL HIGH (ref 70–99)
Potassium: 3.9 mmol/L (ref 3.5–5.1)
Sodium: 138 mmol/L (ref 135–145)

## 2024-04-02 LAB — T4, FREE: Free T4: 0.68 ng/dL (ref 0.61–1.12)

## 2024-04-02 LAB — TSH: TSH: 2.061 u[IU]/mL (ref 0.350–4.500)

## 2024-04-02 LAB — MAGNESIUM: Magnesium: 2.3 mg/dL (ref 1.7–2.4)

## 2024-04-02 NOTE — Discharge Instructions (Addendum)
 Please discuss with your cardiologist regarding your palpitations and to see if increasing your dose of metoprolol would be indicated.  Please follow-up with your doctor for lab work as scheduled today and for echocardiogram as scheduled tomorrow.

## 2024-04-02 NOTE — ED Triage Notes (Signed)
 Pt arrives via ems from home with c/o heart palpitations that woke her from sleep. Pt reports tachycardia at 160 on her personal heart monitor and states the palpitations lasted 5 minutes. Pt currently denies CP and SOB.   BP 162/91 HR 105 97% RA

## 2024-04-02 NOTE — ED Provider Notes (Signed)
 [facility] Provider Note    Event Date/Time   First MD Initiated Contact with Patient 04/02/24 0117     (approximate)   History   No chief complaint on file.   HPI  Amber Mayer is a 58 y.o. female with history of hypertension, diabetes, hyperlipidemia, hypothyroidism who presents to the emergency department with palpitations.  States this has been ongoing intermittently.  States that her heart rate will sometimes be in the 120s and then resolve on its own.  She does have occasional palpitations which she feels like her "heart is fluttering".  She states tonight at rest her heart rate went into the 160s which is the highest it has ever been.  She has been seen by cardiology as an outpatient and has an echocardiogram scheduled tomorrow.  She is also supposed to have fasting labs drawn in the morning.  She states he just wore a Zio patch for a week and just mailed it in.  She has no known history of arrhythmia.  She denies having any chest pain or chest discomfort, shortness of breath.  No fevers, cough, vomiting or diarrhea, dysuria, bloody stools or melena.  No change in caffeine intake or other stimulant use, illicit drug use.  No changes in medication.  Currently asymptomatic other than feeling occasional sensation that her "heart is skipping a beat".  No history of PE, DVT, exogenous estrogen use, recent fractures, surgery, trauma, hospitalization, prolonged travel or other immobilization. No lower extremity swelling or pain. No calf tenderness.    History provided by patient, husband.    Past Medical History:  Diagnosis Date   Allergic rhinitis    Anemia    Anxiety disorder    Borderline diabetes    Dependent edema    Diabetes mellitus without complication (HCC)    Exogenous obesity    Familial hypercholesteremia    Hashimoto's thyroiditis    Hyperlipidemia    Hypertension    Normochromic anemia    Thyroid disease    Vitamin B12 deficiency     Vitamin D deficiency     Past Surgical History:  Procedure Laterality Date   cesarian section     4x   COLONOSCOPY WITH PROPOFOL  N/A 05/03/2021   Procedure: COLONOSCOPY WITH PROPOFOL ;  Surgeon: Irby Mannan, MD;  Location: [facility];  Service: Endoscopy;  Laterality: N/A;   COMBINED HYSTEROSCOPY DIAGNOSTIC / D&C     ESOPHAGEAL DILATION  05/03/2021   Procedure: ESOPHAGEAL DILATION;  Surgeon: Irby Mannan, MD;  Location: [facility];  Service: Endoscopy;;   ESOPHAGOGASTRODUODENOSCOPY (EGD) WITH PROPOFOL  N/A 05/03/2021   Procedure: ESOPHAGOGASTRODUODENOSCOPY (EGD) WITH PROPOFOL ;  Surgeon: Irby Mannan, MD;  Location: [facility];  Service: Endoscopy;  Laterality: N/A;   HYSTEROSCOPY     TUBAL LIGATION      MEDICATIONS:  Prior to Admission medications   Medication Sig Start Date End Date Taking? Authorizing Provider  cetirizine -pseudoephedrine  (ZYRTEC -D) 5-120 MG tablet Take 1 tablet by mouth 2 (two) times daily. 06/25/21   Cook, Jayce G, DO  clotrimazole -betamethasone  (LOTRISONE ) cream APPLY TOPICALLY TO THE AFFECTED AREA TWICE DAILY 02/22/24   Dove, Myra C, MD  gabapentin  (NEURONTIN ) 100 MG capsule Take 1 capsule (100 mg total) by mouth 3 (three) times daily. 08/22/23   Brimage, Vondra, DO  methocarbamol  (ROBAXIN ) 500 MG tablet Take 1 tablet (500 mg total) by mouth 2 (two) times daily. 08/22/23   Brimage, Vondra, DO  Na Sulfate-K Sulfate-Mg Sulf 17.5-3.13-1.6  GM/177ML SOLN At 5 PM the day before procedure take 1 bottle and 5 hours before procedure take 1 bottle. Patient not taking: Reported on 02/06/2023 04/05/21   Irby Mannan, MD  Na Sulfate-K Sulfate-Mg Sulf 17.5-3.13-1.6 GM/177ML SOLN At 5 PM two days before procedure take 1 bottle. Patient not taking: Reported on 02/06/2023 04/05/21   Tahiliani, Varnita B, MD  naproxen  (NAPROSYN ) 500 MG tablet Take 1 tablet (500 mg total) by mouth 2 (two) times daily with a meal. 08/22/23   Brimage,  Alean Amen, DO  omeprazole  (PRILOSEC) 20 MG capsule Take 1 capsule (20 mg total) by mouth daily. 05/05/21 07/04/21  Tahiliani, Varnita B, MD  promethazine -dextromethorphan (PROMETHAZINE -DM) 6.25-15 MG/5ML syrup Take 5 mLs by mouth 4 (four) times daily as needed for cough. Patient not taking: Reported on 02/06/2023 06/25/21   Cook, Jayce G, DO  thyroid  (ARMOUR) 90 MG tablet Take 90 mg by mouth daily.    [provider]  Vitamin D, Ergocalciferol, (DRISDOL) 50000 units CAPS capsule Take 50,000 Units by mouth. Twice weekly.    [provider]    Physical Exam   Triage Vital Signs: ED Triage Vitals  Encounter Vitals Group     BP 04/02/24 0120 (!) 157/85     Systolic BP Percentile --      Diastolic BP Percentile --      Pulse Rate 04/02/24 0117 93     Resp 04/02/24 0117 15     Temp 04/02/24 0120 98.2 F (36.8 C)     Temp Source 04/02/24 0120 Oral     SpO2 04/02/24 0117 98 %     Weight 04/02/24 0120 260 lb (117.9 kg)     Height 04/02/24 0120 5\' 5"  (1.651 m)     Head Circumference --      Peak Flow --      Pain Score 04/02/24 0120 0     Pain Loc --      Pain Education --      Exclude from Growth Chart --     Most recent vital signs: Vitals:   04/02/24 0230 04/02/24 0415  BP: (!) 143/67 137/68  Pulse: 74 81  Resp:  14  Temp:  98.1 F (36.7 C)  SpO2: 99% 96%    CONSTITUTIONAL: Alert, responds appropriately to questions. Well-appearing; well-nourished HEAD: Normocephalic, atraumatic EYES: Conjunctivae clear, pupils appear equal, sclera nonicteric ENT: normal nose; moist mucous membranes NECK: Supple, normal ROM CARD: RRR; S1 and S2 appreciated RESP: Normal chest excursion without splinting or tachypnea; breath sounds clear and equal bilaterally; no wheezes, no rhonchi, no rales, no hypoxia or respiratory distress, speaking full sentences ABD/GI: Non-distended; soft, non-tender, no rebound, no guarding, no peritoneal signs BACK: The back appears normal EXT: Normal  ROM in all joints; no deformity noted, no edema, no calf tenderness or calf swelling SKIN: Normal color for age and race; warm; no rash on exposed skin NEURO: Moves all extremities equally, normal speech, normal gait, no facial asymmetry PSYCH: The patient's mood and manner are appropriate.   ED Results / Procedures / Treatments   LABS: (all labs ordered are listed, but only abnormal results are displayed) Labs Reviewed  BASIC METABOLIC PANEL WITH GFR - Abnormal; Notable for the following components:      Result Value   Glucose, Bld 139 (*)    All other components within normal limits  URINALYSIS, ROUTINE W REFLEX MICROSCOPIC - Abnormal; Notable for the following components:   Color, Urine STRAW (*)  APPearance CLEAR (*)    Hgb urine dipstick SMALL (*)    Bacteria, UA RARE (*)    All other components within normal limits  CBC WITH DIFFERENTIAL/PLATELET  MAGNESIUM  TSH  T4, FREE  TROPONIN I (HIGH SENSITIVITY)     EKG:  EKG Interpretation Date/Time:  Wednesday April 02 2024 01:18:20 EDT Ventricular Rate:  90 PR Interval:  209 QRS Duration:  95 QT Interval:  378 QTC Calculation: 463 R Axis:   234  Text Interpretation: Sinus rhythm Borderline prolonged PR interval Probable right ventricular hypertrophy Confirmed by Verneda Golder [phone]) on 04/02/2024 1:57:23 AM         RADIOLOGY: My personal review and interpretation of imaging: Chest x-ray clear.  I have personally reviewed all radiology reports.   DG Chest Portable 1 View Result Date: 04/02/2024 CLINICAL DATA:  Palpitations. EXAM: PORTABLE CHEST 1 VIEW COMPARISON:  None Available. FINDINGS: The heart size and mediastinal contours are within normal limits. Both lungs are clear. The visualized skeletal structures are unremarkable. IMPRESSION: No active disease. Electronically Signed   By: Virgle Grime M.D.   On: 04/02/2024 02:15     PROCEDURES:  Critical Care performed: No      .1-3 Lead EKG  Interpretation  Performed by: Jailyn Langhorst, Clover Dao, DO Authorized by: Rayvon Dakin, Clover Dao, DO     Interpretation: normal     ECG rate:  95   ECG rate assessment: normal     Rhythm: sinus rhythm     Ectopy: none     Conduction: normal       IMPRESSION / MDM / ASSESSMENT AND PLAN / ED COURSE  I reviewed the triage vital signs and the nursing notes.    Patient here for palpitations.  Now asymptomatic other than feeling occasional sensation of "heart skipping a beat".  The patient is on the cardiac monitor to evaluate for evidence of arrhythmia and/or significant heart rate changes.   DIFFERENTIAL DIAGNOSIS (includes but not limited to):   Arrhythmia, anemia, electrolyte derangement, thyroid dysfunction, ACS, PE, doubt dissection, CHF, pneumonia, pneumothorax   Patient's presentation is most consistent with acute presentation with potential threat to life or bodily function.   PLAN: EKG shows no interval changes, ischemia.  Will obtain labs, chest x-ray, urine.  Will monitor on cardiac monitoring.   MEDICATIONS GIVEN IN ED: Medications - No data to display   ED COURSE:   Normal hemoglobin, electrolytes, thyroid function studies.  Negative troponin.  Chest x-ray reviewed and interpreted by myself and radiologist and shows no acute abnormality.  Urine shows no sign of infection, dehydration.  No evidence seen on cardiac monitoring.  Patient currently asymptomatic.  Recommended close follow-up with her cardiologist and discuss if she should increase her dose of metoprolol.  Heart rate currently in the 80s here in sinus rhythm.  She has follow-up this morning to have routine blood work drawn and an echocardiogram scheduled tomorrow.  She just sent off a Zio patch in the mail and is awaiting these results.  Discussed return precautions with patient and husband but I think she is safe for discharge home at this time.  She is comfortable with this plan.   At this time, I do not feel  there is any life-threatening condition present. I reviewed all nursing notes, vitals, pertinent previous records.  All lab and urine results, EKGs, imaging ordered have been independently reviewed and interpreted by myself.  I reviewed all available radiology reports from any imaging ordered this visit.  Based on my assessment, I feel the patient is safe to be discharged home without further emergent workup and can continue workup as an outpatient as needed. Discussed all findings, treatment plan as well as usual and customary return precautions.  They verbalize understanding and are comfortable with this plan.  Outpatient follow-up has been provided as needed.  All questions have been answered.    CONSULTS:  none   OUTSIDE RECORDS REVIEWED: Reviewed cardiology note with Dr. Irena Manners at [facility] on 03/14/2024.       FINAL CLINICAL IMPRESSION(S) / ED DIAGNOSES   Final diagnoses:  Palpitations     Rx / DC Orders   ED Discharge Orders     None        Note:  This document was prepared using Dragon voice recognition software and may include unintentional dictation errors.   Darris Carachure, Clover Dao, DO 04/02/24 [id number]

## 2024-04-02 NOTE — ED Notes (Signed)
 CCMD called Pt placed on cardiac monitoring

## 2024-04-21 ENCOUNTER — Ambulatory Visit: Admitting: Obstetrics & Gynecology

## 2024-05-15 DIAGNOSIS — I493 Ventricular premature depolarization: Secondary | ICD-10-CM | POA: Insufficient documentation

## 2024-05-15 DIAGNOSIS — E039 Hypothyroidism, unspecified: Secondary | ICD-10-CM | POA: Insufficient documentation

## 2024-05-15 DIAGNOSIS — G4733 Obstructive sleep apnea (adult) (pediatric): Secondary | ICD-10-CM | POA: Insufficient documentation

## 2024-06-03 ENCOUNTER — Ambulatory Visit: Admitting: Cardiology

## 2024-08-09 NOTE — Progress Notes (Signed)
 Sleep Medicine   Office Visit  Patient Name: Amber Mayer DOB: July 06, 1966 MRN [id number]    Chief Complaint: SS results  Brief History:  [name] presents for an initial sleep evaluation to establish care and discuss sleep study results. Patient has a 3 month history of sleep apnea. Patient reports her sleep quality is good. This is noted most nights. The patient's bed partner reports  snoring and some breath pauses at night. The patient relates the following symptoms: snoring that wakes her, morning headaches, daytime sleepiness, sleepy driving, brain fog and trouble focusing are also present. The patient goes to sleep at 9:30-10pm and wakes up at 6:30am. Patient reports waking 3 times a night to use the restroom. Sleep quality is worse when outside home environment.  Patient has noted no restlessness of her legs at night.  The patient  relates vivid dreams as unusual behavior during the night.  The patient denies a history of psychiatric problems. The Epworth Sleepiness Score is 6 out of 24.  The patient relates  Cardiovascular risk factors include: PVCs/palpitations. Reviewed sleep studies. HST showing severe OSA. Patient then had 2 titration studies, but unfortunately patient was not controlled on CPAP @ 20cm H2O and then did not sleep well in lab despite some improvement with Bipap. Patient also had central apneas emerge with PAP. Recommended to have another Bipap titration with sleep aid and patient plans to discuss this with cardiology, but is ready to schedule. Also stated HOB may need to be elevated to keep pressures down.   ROS  General: (-) fever, (-) chills, (-) night sweat Nose and Sinuses: (-) nasal stuffiness or itchiness, (-) postnasal drip, (-) nosebleeds, (-) sinus trouble. Mouth and Throat: (-) sore throat, (-) hoarseness. Neck: (-) swollen glands, (-) enlarged thyroid , (-) neck pain. Respiratory: - cough, - shortness of breath, - wheezing. Neurologic: - numbness, -  tingling. Psychiatric: - anxiety, - depression Sleep behavior: -sleep paralysis -hypnogogic hallucinations -dream enactment      +vivid dreams -cataplexy -night terrors -sleep walking   Current Medication: Outpatient Encounter Medications as of 08/12/2024  Medication Sig   diltiazem (CARDIZEM) 30 MG tablet Take 30 mg by mouth.   metoprolol succinate (TOPROL-XL) 25 MG 24 hr tablet Take 25 mg by mouth at bedtime.   Na Sulfate-K Sulfate-Mg Sulf 17.5-3.13-1.6 GM/177ML SOLN At 5 PM two days before procedure take 1 bottle. (Patient not taking: Reported on 02/06/2023)   thyroid  (ARMOUR) 90 MG tablet Take 90 mg by mouth daily.   Vitamin D, Ergocalciferol, (DRISDOL) 50000 units CAPS capsule Take 50,000 Units by mouth. Twice weekly.   [DISCONTINUED] cetirizine -pseudoephedrine  (ZYRTEC -D) 5-120 MG tablet Take 1 tablet by mouth 2 (two) times daily.   [DISCONTINUED] clotrimazole -betamethasone  (LOTRISONE ) cream APPLY TOPICALLY TO THE AFFECTED AREA TWICE DAILY   [DISCONTINUED] gabapentin  (NEURONTIN ) 100 MG capsule Take 1 capsule (100 mg total) by mouth 3 (three) times daily.   [DISCONTINUED] methocarbamol  (ROBAXIN ) 500 MG tablet Take 1 tablet (500 mg total) by mouth 2 (two) times daily.   [DISCONTINUED] Na Sulfate-K Sulfate-Mg Sulf 17.5-3.13-1.6 GM/177ML SOLN At 5 PM the day before procedure take 1 bottle and 5 hours before procedure take 1 bottle. (Patient not taking: Reported on 02/06/2023)   [DISCONTINUED] naproxen  (NAPROSYN ) 500 MG tablet Take 1 tablet (500 mg total) by mouth 2 (two) times daily with a meal.   [DISCONTINUED] omeprazole  (PRILOSEC) 20 MG capsule Take 1 capsule (20 mg total) by mouth daily.   [DISCONTINUED] promethazine -dextromethorphan (PROMETHAZINE -DM) 6.25-15 MG/5ML syrup Take 5  mLs by mouth 4 (four) times daily as needed for cough. (Patient not taking: Reported on 02/06/2023)   No facility-administered encounter medications on file as of 08/12/2024.    Surgical History: Past Surgical  History:  Procedure Laterality Date   cesarian section     4x   COLONOSCOPY WITH PROPOFOL  N/A 05/03/2021   Procedure: COLONOSCOPY WITH PROPOFOL ;  Surgeon: Janalyn Keene NOVAK, MD;  Location: [facility];  Service: Endoscopy;  Laterality: N/A;   COMBINED HYSTEROSCOPY DIAGNOSTIC / D&C     ESOPHAGEAL DILATION  05/03/2021   Procedure: ESOPHAGEAL DILATION;  Surgeon: Janalyn Keene NOVAK, MD;  Location: [facility];  Service: Endoscopy;;   ESOPHAGOGASTRODUODENOSCOPY (EGD) WITH PROPOFOL  N/A 05/03/2021   Procedure: ESOPHAGOGASTRODUODENOSCOPY (EGD) WITH PROPOFOL ;  Surgeon: Janalyn Keene NOVAK, MD;  Location: [facility];  Service: Endoscopy;  Laterality: N/A;   HYSTEROSCOPY     TUBAL LIGATION      Medical History: Past Medical History:  Diagnosis Date   Allergic rhinitis    Anemia    Anxiety disorder    Borderline diabetes    Dependent edema    Diabetes mellitus without complication (HCC)    Exogenous obesity    Familial hypercholesteremia    Hashimoto's thyroiditis    Hyperlipidemia    Hypertension    Normochromic anemia    Thyroid  disease    Vitamin B12 deficiency    Vitamin D deficiency     Family History: Non contributory to the present illness  Social History: Social History   Socioeconomic History   Marital status: Married    Spouse name: Not on file   Number of children: Not on file   Years of education: Not on file   Highest education level: Not on file  Occupational History   Not on file  Tobacco Use   Smoking status: Never   Smokeless tobacco: Never  Vaping Use   Vaping status: Never Used  Substance and Sexual Activity   Alcohol use: No   Drug use: Never   Sexual activity: Yes    Birth control/protection: None, Surgical  Other Topics Concern   Not on file  Social History Narrative   Not on file   Social Drivers of Health   Financial Resource Strain: Not on file  Food Insecurity: Not on file  Transportation Needs: Not on file   Physical Activity: Not on file  Stress: Not on file  Social Connections: Not on file  Intimate Partner Violence: Not on file    Vital Signs: Blood pressure (!) 140/83, pulse 90, resp. rate 16, height 5' 5 (1.651 m), weight 239 lb 3.2 oz (108.5 kg), last menstrual period 09/20/2020, SpO2 96%. Body mass index is 39.8 kg/m.   Examination: General Appearance: The patient is well-developed, well-nourished, and in no distress. Neck Circumference: 40.5 cm Skin: Gross inspection of skin unremarkable. Head: normocephalic, no gross deformities. Eyes: no gross deformities noted. ENT: ears appear grossly normal Neurologic: Alert and oriented. No involuntary movements.    STOP BANG RISK ASSESSMENT S (snore) Have you been told that you snore?     YES   T (tired) Are you often tired, fatigued, or sleepy during the day?   YES  O (obstruction) Do you stop breathing, choke, or gasp during sleep? YES   P (pressure) Do you have or are you being treated for high blood pressure? NO   B (BMI) Is your body index greater than 35 kg/m? YES   A (age) Are you 61  years old or older? YES   N (neck) Do you have a neck circumference greater than 16 inches?   YES   G (gender) Are you a female? NO   TOTAL STOP/BANG "YES" ANSWERS 6                                                               A STOP-Bang score of 2 or less is considered low risk, and a score of 5 or more is high risk for having either moderate or severe OSA. For people who score 3 or 4, doctors may need to perform further assessment to determine how likely they are to have OSA.         EPWORTH SLEEPINESS SCALE:  Scale:  (0)= no chance of dozing; (1)= slight chance of dozing; (2)= moderate chance of dozing; (3)= high chance of dozing  Chance  Situtation    Sitting and reading: 1    Watching TV: 1    Sitting Inactive in public: 0    As a passenger in car: 2      Lying down to rest: 1    Sitting and talking: 0    Sitting  quielty after lunch: 1    In a car, stopped in traffic: 0   TOTAL SCORE:   6 out of 24    SLEEP STUDIES:  HST (04/2024) REI 71.9/hr , Min Sp02 55% CPAP Titration (06/2024) CPAP@ 20 cmH20 , Frequent respiratory events persisted when supine with maximum CPAP pressure.    BIPAP Titration (07/2024) Patients OSA slightly improved with the use of nasal BIPAP and was complicated by frequent centrals and poor sleep quality. Recommend repeating BIPAP titration in the supine position with a sleep aid.    LABS: No results found for this or any previous visit (from the past 2160 hours).  Radiology: DG Chest Portable 1 View Result Date: 04/02/2024 CLINICAL DATA:  Palpitations. EXAM: PORTABLE CHEST 1 VIEW COMPARISON:  None Available. FINDINGS: The heart size and mediastinal contours are within normal limits. Both lungs are clear. The visualized skeletal structures are unremarkable. IMPRESSION: No active disease. Electronically Signed   By: Suzen Dials M.D.   On: 04/02/2024 02:15    No results found.  No results found.    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 OSA (obstructive sleep apnea) (Primary) The study results, diagnosis and treatment recommendations were discussed with the patient. Will move forward with repeat Bipap titration with use of sleep aid as recommended.  2. Frequent PVCs Followed by cardiology  3. Hypothyroidism, unspecified type Continue current medication and f/u with PCP.  4. Obesity (BMI 30-39.9) Obesity Counseling: Had a lengthy discussion regarding patients BMI and weight issues. Patient was instructed on portion control as well as increased activity. Also discussed caloric restrictions with trying to maintain  intake less than 2000 Kcal. Discussions were made in accordance with the 5As of weight management. Simple actions such as not eating late and if able  to, taking a walk is suggested.    General Counseling: I have discussed the findings of the evaluation and examination with Barnie.  I have also discussed any further diagnostic evaluation thatmay be needed or ordered today. Velicia verbalizes understanding of the findings of todays visit. We also reviewed her medications today and discussed drug interactions and side effects including but not limited excessive drowsiness and altered mental states. We also discussed that there is always a risk not just to her but also people around her. she has been encouraged to call the office with any questions or concerns that should arise related to todays visit.  No orders of the defined types were placed in this encounter.       I have personally obtained a history, evaluated the patient, evaluated pertinent data, formulated the assessment and plan and placed orders.  This patient was seen by Tinnie Pro, PA-C in collaboration with Dr. Elfreda Bathe as a part of collaborative care agreement.    Elfreda DELENA Bathe, MD [facility] Diplomate ABMS Pulmonary and Critical Care Medicine Sleep medicine

## 2024-08-12 ENCOUNTER — Ambulatory Visit: Payer: Self-pay | Admitting: Internal Medicine

## 2024-08-12 VITALS — BP 140/83 | HR 90 | Resp 16 | Ht 65.0 in | Wt 239.2 lb

## 2024-08-12 DIAGNOSIS — I493 Ventricular premature depolarization: Secondary | ICD-10-CM | POA: Diagnosis not present

## 2024-08-12 DIAGNOSIS — E039 Hypothyroidism, unspecified: Secondary | ICD-10-CM

## 2024-08-12 DIAGNOSIS — G4733 Obstructive sleep apnea (adult) (pediatric): Secondary | ICD-10-CM | POA: Diagnosis not present

## 2024-08-12 DIAGNOSIS — E669 Obesity, unspecified: Secondary | ICD-10-CM

## 2024-08-12 NOTE — Patient Instructions (Signed)
 Living With Sleep Apnea Sleep apnea is a condition that affects your breathing while you're sleeping. Your tongue or the tissue in your throat may block the flow of air while you sleep. You may have shallow breathing or stop breathing for short periods of time. The breaks in breathing interrupt the deep sleep that you need to feel rested. Even if you don't wake up from the gaps in breathing, you may feel tired during the day. People with sleep apnea may snore loudly. You may have a headache in the morning and feel anxious or depressed. How can sleep apnea affect me? Sleep apnea increases your chances of being very tired during the day. This is called daytime fatigue. Sleep apnea can also increase your risk of: Heart attack. Stroke. Obesity. Type 2 diabetes. Heart failure. Irregular heartbeat. High blood pressure. If you are very tired during the day, you may be more likely to: Not do well in school or at work. Fall asleep while driving. Have trouble paying attention. Develop depression or anxiety. Have problems having sex. This is called sexual dysfunction. What actions can I take to manage sleep apnea? Sleep apnea treatment  If you were given a device to open your airway while you sleep, use it only as told by your health care provider. You may be given: An oral appliance. This is a mouthpiece that shifts your lower jaw forward. A continuous positive airway pressure (CPAP) device. This blows air through a mask. A nasal expiratory positive airway pressure (EPAP) device. This has valves that you put into each nostril. A bi-level positive airway pressure (BIPAP) device. This blows air through a mask when you breathe in and breathe out. You may need surgery if other treatments don't work for you. Sleep habits Go to sleep and wake up at the same time every day. This helps set your internal clock for sleeping. If you stay up later than usual on weekends, try to get up in the morning within 2  hours of the time you usually wake up. Try to get at least 7-9 hours of sleep each night. Stop using a computer, tablet, and mobile phone a few hours before bedtime. Do not take long naps during the day. If you nap, limit it to 30 minutes. Have a relaxing bedtime routine. Reading or listening to music may relax you and help you sleep. Use your bedroom only for sleep. Keep your television and computer out of your bedroom. Keep your bedroom cool, dark, and quiet. Use a supportive mattress and pillows. Follow your provider's instructions for other changes to sleep habits. Nutrition Do not eat big meals in the evening. Do not have caffeine in the later part of the day. The effects of caffeine can last for more than 5 hours. Follow your provider's instructions for any changes to what you eat and drink. Lifestyle Do not drink alcohol before bedtime. Alcohol can cause you to fall asleep at first, but then it can cause you to wake up in the middle of the night and have trouble getting back to sleep. Do not smoke, vape, or use nicotine or tobacco. Medicines Take over-the-counter and prescription medicines only as told by your provider. Do not use over-the-counter sleep medicine. You may become dependent on this medicine, and it can make sleep apnea worse. Do not take medicines, such as sedatives and narcotics, unless told to by your provider. Activity Exercise on most days, but avoid exercising in the evening. Exercising near bedtime can interfere with sleeping.  If possible, spend time outside every day. Natural light helps with your internal clock. General information Lose weight if you need to. Stay at a healthy weight. If you are having surgery, make sure to tell your provider that you have sleep apnea. You may need to bring your device with you. Keep all follow-up visits. Your provider will want to check on your condition. Where to find more information National Heart, Lung, and Blood  Institute: [employer] This information is not intended to replace advice given to you by your health care provider. Make sure you discuss any questions you have with your health care provider. Document Revised: 03/21/2023 Document Reviewed: 03/21/2023 Elsevier Patient Education  2024 [employer].

## 2024-12-03 NOTE — Progress Notes (Signed)
 [facility] [address] [place], [place] [address]  Pulmonary Sleep Medicine   Office Visit Note  Patient Name: Amber Mayer DOB: 1966/05/24 MRN [id number]    Chief Complaint: Obstructive Sleep Apnea visit  Brief History:  Amber Mayer is seen today for an initial consult for BiPAP@24 /17 cmH2O. The patient has a 7 month history of sleep apnea. Patient is not using PAP nightly.  The patient feels rested after sleeping with PAP.  The patient reports benefiting from PAP use. Reported sleepiness is  improved and the Epworth Sleepiness Score is 4 out of 24. The patient does occasionally take naps. The patient complains of the following: pt is complaining of bloating of her stomach. The compliance download shows  2% compliance with an average use time of 2 hours 20 minutes. The AHI is 2.8.  The patient does not complain of limb movements disrupting sleep. The patient continues to require PAP therapy in order to eliminate sleep apnea.   ROS  General: (-) fever, (-) chills, (-) night sweat Nose and Sinuses: (-) nasal stuffiness or itchiness, (-) postnasal drip, (-) nosebleeds, (-) sinus trouble. Mouth and Throat: (-) sore throat, (-) hoarseness. Neck: (-) swollen glands, (-) enlarged thyroid , (-) neck pain. Respiratory: - cough, - shortness of breath, - wheezing. Neurologic: - numbness, - tingling. Psychiatric: - anxiety, - depression   Current Medication: Outpatient Encounter Medications as of 12/08/2024  Medication Sig   diltiazem (CARDIZEM) 30 MG tablet Take 30 mg by mouth.   metoprolol succinate (TOPROL-XL) 25 MG 24 hr tablet Take 25 mg by mouth at bedtime.   Na Sulfate-K Sulfate-Mg Sulf 17.5-3.13-1.6 GM/177ML SOLN At 5 PM two days before procedure take 1 bottle. (Patient not taking: Reported on 02/06/2023)   thyroid  (ARMOUR) 90 MG tablet Take 90 mg by mouth daily.   Vitamin D, Ergocalciferol, (DRISDOL) 50000 units CAPS capsule Take 50,000 Units by mouth. Twice weekly.    No facility-administered encounter medications on file as of 12/08/2024.    Surgical History: Past Surgical History:  Procedure Laterality Date   cesarian section     4x   COLONOSCOPY WITH PROPOFOL  N/A 05/03/2021   Procedure: COLONOSCOPY WITH PROPOFOL ;  Surgeon: Janalyn Keene NOVAK, MD;  Location: [facility];  Service: Endoscopy;  Laterality: N/A;   COMBINED HYSTEROSCOPY DIAGNOSTIC / D&C     ESOPHAGEAL DILATION  05/03/2021   Procedure: ESOPHAGEAL DILATION;  Surgeon: Janalyn Keene NOVAK, MD;  Location: [facility];  Service: Endoscopy;;   ESOPHAGOGASTRODUODENOSCOPY (EGD) WITH PROPOFOL  N/A 05/03/2021   Procedure: ESOPHAGOGASTRODUODENOSCOPY (EGD) WITH PROPOFOL ;  Surgeon: Janalyn Keene NOVAK, MD;  Location: [facility];  Service: Endoscopy;  Laterality: N/A;   HYSTEROSCOPY     TUBAL LIGATION      Medical History: Past Medical History:  Diagnosis Date   Allergic rhinitis    Anemia    Anxiety disorder    Borderline diabetes    Dependent edema    Diabetes mellitus without complication (HCC)    Exogenous obesity    Familial hypercholesteremia    Hashimoto's thyroiditis    Hyperlipidemia    Hypertension    Normochromic anemia    Thyroid  disease    Vitamin B12 deficiency    Vitamin D deficiency     Family History: Non contributory to the present illness  Social History: Social History   Socioeconomic History   Marital status: Married    Spouse name: Not on file   Number of children: Not on file   Years  of education: Not on file   Highest education level: Not on file  Occupational History   Not on file  Tobacco Use   Smoking status: Never   Smokeless tobacco: Never  Vaping Use   Vaping status: Never Used  Substance and Sexual Activity   Alcohol use: No   Drug use: Never   Sexual activity: Yes    Birth control/protection: None, Surgical  Other Topics Concern   Not on file  Social History Narrative   Not on file   Social Drivers of  Health   Tobacco Use: Low Risk (12/08/2024)   Patient History    Smoking Tobacco Use: Never    Smokeless Tobacco Use: Never    Passive Exposure: Not on file  Financial Resource Strain: Not on file  Food Insecurity: Not on file  Transportation Needs: Not on file  Physical Activity: Not on file  Stress: Not on file  Social Connections: Not on file  Intimate Partner Violence: Not on file  Depression (EYV7-0): Not on file  Alcohol Screen: Not on file  Housing: Unknown (08/06/2024)   Received from [facility]   Epic    Unable to Pay for Housing in the Last Year: Not on file    Number of Times Moved in the Last Year: Not on file    At any time in the past 12 months, were you homeless or living in a shelter (including now)?: No  Utilities: Not on file  Health Literacy: Not on file    Vital Signs: Blood pressure (!) 151/87, pulse 81, resp. rate 16, height 5' 5 (1.651 m), weight 246 lb (111.6 kg), last menstrual period 09/20/2020, SpO2 98%. Body mass index is 40.94 kg/m.    Examination: General Appearance: The patient is well-developed, well-nourished, and in no distress. Neck Circumference: 45 cm Skin: Gross inspection of skin unremarkable. Head: normocephalic, no gross deformities. Eyes: no gross deformities noted. ENT: ears appear grossly normal Neurologic: Alert and oriented. No involuntary movements.  STOP BANG RISK ASSESSMENT S (snore) Have you been told that you snore?     YES   T (tired) Are you often tired, fatigued, or sleepy during the day?   YES  O (obstruction) Do you stop breathing, choke, or gasp during sleep? YES   P (pressure) Do you have or are you being treated for high blood pressure? NO   B (BMI) Is your body index greater than 35 kg/m? YES   A (age) Are you 72 years old or older? YES   N (neck) Do you have a neck circumference greater than 16 inches?   YES   G (gender) Are you a female? NO   TOTAL STOP/BANG YES ANSWERS 6        A STOP-Bang score of 2 or less is considered low risk, and a score of 5 or more is high risk for having either moderate or severe OSA. For people who score 3 or 4, doctors may need to perform further assessment to determine how likely they are to have OSA.         EPWORTH SLEEPINESS SCALE:  Scale:  (0)= no chance of dozing; (1)= slight chance of dozing; (2)= moderate chance of dozing; (3)= high chance of dozing  Chance  Situtation    Sitting and reading: 1    Watching TV: 1    Sitting Inactive in public: 0    As a passenger in car: 1      Lying down  to rest: 1    Sitting and talking: 0    Sitting quielty after lunch: 0    In a car, stopped in traffic: 0   TOTAL SCORE:   4 out of 24    SLEEP STUDIES:  HST (04/2024) AHI 71.9/hr, min SpO2 55% Titration (06/2024) CPAP@ 20. Unable to tolerate, so Bipap titration Titration (07/2024) Repeat bipap titration in supine with sleep aid. Titration (08/2024) BiPAP@ 24/17 cmH2O   CPAP COMPLIANCE DATA:  Date Range: 10/17/2024-11/30/2024  Average Daily Use: 2 hours 20 minutes  Median Use: 2 hours 6 minutes  Compliance for > 4 Hours: 2%  AHI: 2.8 respiratory events per hour  Days Used: 10/45 days  Mask Leak: 18.8  95th Percentile Pressure: 24/17         LABS: No results found for this or any previous visit (from the past 2160 hours).  Radiology: DG Chest Portable 1 View Result Date: 04/02/2024 CLINICAL DATA:  Palpitations. EXAM: PORTABLE CHEST 1 VIEW COMPARISON:  None Available. FINDINGS: The heart size and mediastinal contours are within normal limits. Both lungs are clear. The visualized skeletal structures are unremarkable. IMPRESSION: No active disease. Electronically Signed   By: Suzen Dials M.D.   On: 04/02/2024 02:15    No results found.  No results found.    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 OSA (obstructive sleep apnea) (Primary) The patient does tolerate PAP and reports  benefit from PAP use. She reports gas and bloating and would like to try a pressure reduction and we will try 22/15. The patient was reminded how to clean equipment and advised to replace supplies routinely. The patient was also counselled on weight loss. The compliance is poor, she was coached to increase her consistency.. The AHI is 2.8.   OSA on cpap- controlled. Increase compliance with pap. CPAP continues to be medically necessary to treat this patient's OSA. F/u 25m  2. CPAP use counseling CPAP Counseling: had a lengthy discussion with the patient regarding the importance of PAP therapy in management of the sleep apnea. Patient appears to understand the risk factor reduction and also understands the risks associated with untreated sleep apnea. Patient will try to make a good faith effort to remain compliant with therapy. Also instructed the patient on proper cleaning of the device including the water  must be changed daily if possible and use of distilled water  is preferred. Patient understands that the machine should be regularly cleaned with appropriate recommended cleaning solutions that do not damage the PAP machine for example given white vinegar and water  rinses. Other methods such as ozone treatment may not be as good as these simple methods to achieve cleaning.      General Counseling: I have discussed the findings of the evaluation and examination with Barnie.  I have also discussed any further diagnostic evaluation thatmay be needed or ordered today. Saryiah verbalizes understanding of the findings of todays visit. We also reviewed her medications today and discussed drug interactions and side effects including but  not limited excessive drowsiness and altered mental states. We also discussed that there is always a risk not just to her but also people around  her. she has been encouraged to call the office with any questions or concerns that should arise related to todays visit.  No orders of the defined types were placed in this encounter.       I have personally obtained a history, examined the patient, evaluated laboratory and imaging results, formulated the assessment and plan and placed orders. This patient was seen today by Lauraine Lay, PA-C in collaboration with Dr. Elfreda Bathe.   Elfreda DELENA Bathe, MD [facility] Diplomate ABMS Pulmonary Critical Care Medicine and Sleep Medicine

## 2024-12-08 ENCOUNTER — Ambulatory Visit: Admitting: Internal Medicine

## 2024-12-08 VITALS — BP 151/87 | HR 81 | Resp 16 | Ht 65.0 in | Wt 246.0 lb

## 2024-12-08 DIAGNOSIS — G4733 Obstructive sleep apnea (adult) (pediatric): Secondary | ICD-10-CM

## 2024-12-08 DIAGNOSIS — Z7189 Other specified counseling: Secondary | ICD-10-CM

## 2024-12-08 NOTE — Patient Instructions (Signed)
